# Patient Record
Sex: Female | Born: 1995 | Hispanic: No | Marital: Single
Health system: Southern US, Community
[De-identification: ages and names within clinical notes are randomized; demographics above are authoritative.]

## PROBLEM LIST (undated history)

## (undated) ENCOUNTER — Inpatient Hospital Stay (HOSPITAL_COMMUNITY): Payer: Self-pay

## (undated) DIAGNOSIS — F32A Depression, unspecified: Secondary | ICD-10-CM

## (undated) DIAGNOSIS — F419 Anxiety disorder, unspecified: Secondary | ICD-10-CM

## (undated) DIAGNOSIS — D649 Anemia, unspecified: Secondary | ICD-10-CM

## (undated) HISTORY — DX: Depression, unspecified: F32.A

## (undated) HISTORY — PX: NO PAST SURGERIES: SHX2092

## (undated) HISTORY — DX: Anemia, unspecified: D64.9

## (undated) HISTORY — DX: Anxiety disorder, unspecified: F41.9

---
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
## 2018-01-08 ENCOUNTER — Encounter (HOSPITAL_COMMUNITY): Payer: Self-pay | Admitting: Emergency Medicine

## 2018-01-08 ENCOUNTER — Ambulatory Visit (HOSPITAL_COMMUNITY)
Admission: EM | Admit: 2018-01-08 | Discharge: 2018-01-08 | Disposition: A | Discharge status: Home / Self Care | Payer: BLUE CROSS/BLUE SHIELD | Attending: Family Medicine | Admitting: Family Medicine

## 2018-01-08 DIAGNOSIS — Z3201 Encounter for pregnancy test, result positive: Secondary | ICD-10-CM

## 2018-01-08 DIAGNOSIS — Z7722 Contact with and (suspected) exposure to environmental tobacco smoke (acute) (chronic): Secondary | ICD-10-CM | POA: Diagnosis not present

## 2018-01-08 DIAGNOSIS — L709 Acne, unspecified: Secondary | ICD-10-CM | POA: Insufficient documentation

## 2018-01-08 DIAGNOSIS — Z113 Encounter for screening for infections with a predominantly sexual mode of transmission: Secondary | ICD-10-CM

## 2018-01-08 DIAGNOSIS — Z331 Pregnant state, incidental: Secondary | ICD-10-CM | POA: Diagnosis not present

## 2018-01-08 DIAGNOSIS — N926 Irregular menstruation, unspecified: Secondary | ICD-10-CM

## 2018-01-08 DIAGNOSIS — Z202 Contact with and (suspected) exposure to infections with a predominantly sexual mode of transmission: Secondary | ICD-10-CM | POA: Diagnosis not present

## 2018-01-08 DIAGNOSIS — Z3491 Encounter for supervision of normal pregnancy, unspecified, first trimester: Secondary | ICD-10-CM

## 2018-01-08 DIAGNOSIS — N912 Amenorrhea, unspecified: Secondary | ICD-10-CM | POA: Insufficient documentation

## 2018-01-08 LAB — POCT URINALYSIS DIP (DEVICE)
Bilirubin Urine: NEGATIVE
Glucose, UA: NEGATIVE mg/dL
HGB URINE DIPSTICK: NEGATIVE
Ketones, ur: NEGATIVE mg/dL
NITRITE: NEGATIVE
Protein, ur: NEGATIVE mg/dL
Specific Gravity, Urine: >=1.03 (ref 1.005–1.030)
Urobilinogen, UA: 0.2 mg/dL (ref 0.0–1.0)
pH: 6 (ref 5.0–8.0)

## 2018-01-08 LAB — POCT PREGNANCY, URINE: Preg Test, Ur: POSITIVE — AB

## 2018-01-08 MED ORDER — AZITHROMYCIN 250 MG PO TABS
ORAL_TABLET | ORAL | Status: AC
  Filled 2018-01-08: qty 4

## 2018-01-08 MED ORDER — AZITHROMYCIN 250 MG PO TABS
1000.0000 mg | ORAL_TABLET | Freq: Once | ORAL | Status: AC
  Administered 2018-01-08: 1000 mg via ORAL

## 2018-01-08 MED ORDER — CEFTRIAXONE SODIUM 250 MG IJ SOLR
250.0000 mg | Freq: Once | INTRAMUSCULAR | Status: AC
  Administered 2018-01-08: 250 mg via INTRAMUSCULAR

## 2018-01-08 MED ORDER — PRENATAL COMPLETE 14-0.4 MG PO TABS: 1.0000 | ORAL_TABLET | Freq: Every morning | ORAL | 4 refills | Status: AC

## 2018-01-08 MED ORDER — CEFTRIAXONE SODIUM 250 MG IJ SOLR
INTRAMUSCULAR | Status: AC
  Filled 2018-01-08: qty 250

## 2018-01-08 MED ORDER — CLINDAMYCIN PHOSPHATE 1 % EX GEL: Freq: Two times a day (BID) | CUTANEOUS | 11 refills | Status: DC

## 2018-01-08 MED ORDER — LIDOCAINE HCL (PF) 1 % IJ SOLN
INTRAMUSCULAR | Status: AC
  Filled 2018-01-08: qty 2

## 2018-01-08 NOTE — Discharge Instructions (Addendum)
Make appointment with Dr. Tiburcio Pea to get complete physical  You will need to see an obstetrician about the pregnancy.  I am prescribing prenatal vitamins.

## 2018-01-08 NOTE — ED Triage Notes (Signed)
Pt requesting STD screening  

## 2018-01-08 NOTE — ED Provider Notes (Signed)
[facility]    CSN: [id number] Arrival date & time: 01/08/18  1254     History   Chief Complaint Chief Complaint  Patient presents with  . Exposure to STD    HPI Annette Price is a 22 y.o. female.   Initial visit  22 year old female presents for evaluation of possible STD.  Her boyfriend was treated with shot and pills, told her he had gonorrhea and chlamydia.  No fever, acute abdominal pain, nausea, vomiting, or vaginal bleeding  Last menstrual period was 3 months ago.  Patient has intermittent lower abdominal pains off an on chronically.  Patient works at [employer].     History reviewed. No pertinent past medical history.  There are no active problems to display for this patient.   History reviewed. No pertinent surgical history.  OB History   None      Home Medications    Prior to Admission medications   Medication Sig Start Date End Date Taking? Authorizing Provider  Prenatal Vit-Fe Fumarate-FA (PRENATAL COMPLETE) 14-0.4 MG TABS Take 1 tablet by mouth every morning. 01/08/18   Elvina Sidle, MD    Family History History reviewed. No pertinent family history.  Social History Social History   Tobacco Use  . Smoking status: Passive Smoke Exposure - Never Smoker  Substance Use Topics  . Alcohol use: No  . Drug use: Yes    Types: Marijuana     Allergies   Patient has no known allergies.   Review of Systems Review of Systems   Physical Exam Triage Vital Signs ED Triage Vitals  Enc Vitals Group     BP      Pulse      Resp      Temp      Temp src      SpO2      Weight      Height      Head Circumference      Peak Flow      Pain Score      Pain Loc      Pain Edu?      Excl. in GC?    No data found.  Updated Vital Signs BP 138/84 (BP Location: Left Arm)   Pulse 98   Temp 100 F (37.8 C) (Oral)   Resp 18   SpO2 97%    Physical Exam  Constitutional: She is oriented to person, place, and time. She  appears well-developed and well-nourished.  Marfan body habitus  Eyes: Pupils are equal, round, and reactive to light. Conjunctivae are normal.  Neck: Normal range of motion. Neck supple.  Mild diffusely smooth palpable thyroid  Cardiovascular: Normal rate, regular rhythm and normal heart sounds.  Pulmonary/Chest: Effort normal and breath sounds normal.  Neurological: She is alert and oriented to person, place, and time.  Skin: Skin is warm.  Facial acne  Psychiatric: She has a normal mood and affect. Her behavior is normal.  Nursing note and vitals reviewed.    UC Treatments / Results  Labs (all labs ordered are listed, but only abnormal results are displayed) Labs Reviewed  POCT URINALYSIS DIP (DEVICE) - Abnormal; Notable for the following components:      Result Value   Leukocytes, UA SMALL (*)    All other components within normal limits  POCT PREGNANCY, URINE - Abnormal; Notable for the following components:   Preg Test, Ur POSITIVE (*)    All other components within normal limits  URINE CULTURE  CERVICOVAGINAL ANCILLARY ONLY    EKG None  Radiology No results found.  Procedures Procedures (including critical care time)  Medications Ordered in UC Medications  azithromycin (ZITHROMAX) tablet 1,000 mg (has no administration in time range)  cefTRIAXone (ROCEPHIN) injection 250 mg (has no administration in time range)    Initial Impression / Assessment and Plan / UC Course  I have reviewed the triage vital signs and the nursing notes.  Pertinent labs & imaging results that were available during my care of the patient were reviewed by me and considered in my medical decision making (see chart for details).    Final Clinical Impressions(s) / UC Diagnoses   Final diagnoses:  Possible exposure to STD  Acne, unspecified acne type  Amenorrhea  First trimester pregnancy     Discharge Instructions     Make appointment with Dr. Tiburcio Pea to get complete  physical  You will need to see an obstetrician about the pregnancy.  I am prescribing prenatal vitamins.    ED Prescriptions    Medication Sig Dispense Auth. Provider   clindamycin (CLINDAGEL) 1 % gel  (Status: Discontinued) Apply topically 2 (two) times daily. 30 g Elvina Sidle, MD   Prenatal Vit-Fe Fumarate-FA (PRENATAL COMPLETE) 14-0.4 MG TABS Take 1 tablet by mouth every morning. 60 each Elvina Sidle, MD     Controlled Substance Prescriptions [place] Controlled Substance Registry consulted? No   Elvina Sidle, MD 01/08/18 [phone]

## 2018-01-09 LAB — URINE CYTOLOGY ANCILLARY ONLY
Chlamydia: POSITIVE — AB
Neisseria Gonorrhea: NEGATIVE
Trichomonas: POSITIVE — AB

## 2018-01-09 LAB — URINE CULTURE: Culture: <10000 — AB

## 2018-01-10 ENCOUNTER — Telehealth: Payer: Self-pay | Admitting: Emergency Medicine

## 2018-01-10 MED ORDER — METRONIDAZOLE 500 MG PO TABS: 500.0000 mg | ORAL_TABLET | Freq: Two times a day (BID) | ORAL | 0 refills | Status: DC

## 2018-01-10 NOTE — Telephone Encounter (Signed)
Patient called back and was given results for positive Trichomonas. Rx for Flagyl 500 mg, BID for 7 days was sent to the pharmacy of record. Educated patient to refrain from sexual intercourse for 7 days to give the medicine time to work. Advised patient to notify sexual partners so they can be tested/treated. Condoms may reduce risk of reinfection. Recheck for further evaluation if symptoms are not improving. Patient verbalized understanding and had no further questions or concerns.

## 2018-01-10 NOTE — Telephone Encounter (Signed)
Trichomonas is positive. Rx  for Flagyl 500 mg, BID for 7 days was sent to the pharmacy of record. Pt needs education to refrain from sexual intercourse for 7 days to give the medicine time to work. Sexual partners need to be notified and tested/treated. Condoms may reduce risk of reinfection. Recheck for further evaluation if symptoms are not improving. [facility] notified.  Patient was called no answer, left generic message to return call.

## 2018-01-17 ENCOUNTER — Other Ambulatory Visit: Payer: Self-pay

## 2018-01-17 ENCOUNTER — Emergency Department (HOSPITAL_COMMUNITY)
Admission: EM | Admit: 2018-01-17 | Discharge: 2018-01-17 | Disposition: A | Discharge status: Home / Self Care | Payer: BLUE CROSS/BLUE SHIELD | Attending: Emergency Medicine | Admitting: Emergency Medicine

## 2018-01-17 ENCOUNTER — Encounter (HOSPITAL_COMMUNITY): Payer: Self-pay | Admitting: Pharmacy Technician

## 2018-01-17 DIAGNOSIS — Z7722 Contact with and (suspected) exposure to environmental tobacco smoke (acute) (chronic): Secondary | ICD-10-CM | POA: Diagnosis not present

## 2018-01-17 DIAGNOSIS — Z79899 Other long term (current) drug therapy: Secondary | ICD-10-CM | POA: Insufficient documentation

## 2018-01-17 DIAGNOSIS — Z3A00 Weeks of gestation of pregnancy not specified: Secondary | ICD-10-CM | POA: Diagnosis not present

## 2018-01-17 DIAGNOSIS — Z3490 Encounter for supervision of normal pregnancy, unspecified, unspecified trimester: Secondary | ICD-10-CM

## 2018-01-17 DIAGNOSIS — O219 Vomiting of pregnancy, unspecified: Secondary | ICD-10-CM | POA: Insufficient documentation

## 2018-01-17 DIAGNOSIS — R112 Nausea with vomiting, unspecified: Secondary | ICD-10-CM

## 2018-01-17 LAB — COMPREHENSIVE METABOLIC PANEL
ALT: 13 U/L (ref 0–44)
AST: 18 U/L (ref 15–41)
Albumin: 4.7 g/dL (ref 3.5–5.0)
Alkaline Phosphatase: 43 U/L (ref 38–126)
Anion gap: 10 (ref 5–15)
BUN: 11 mg/dL (ref 6–20)
CHLORIDE: 103 mmol/L (ref 98–111)
CO2: 20 mmol/L — ABNORMAL LOW (ref 22–32)
CREATININE: 0.7 mg/dL (ref 0.44–1.00)
Calcium: 10.1 mg/dL (ref 8.9–10.3)
Glucose, Bld: 92 mg/dL (ref 70–99)
POTASSIUM: 3.7 mmol/L (ref 3.5–5.1)
Sodium: 133 mmol/L — ABNORMAL LOW (ref 135–145)
Total Bilirubin: 0.8 mg/dL (ref 0.3–1.2)
Total Protein: 8 g/dL (ref 6.5–8.1)

## 2018-01-17 LAB — URINALYSIS, ROUTINE W REFLEX MICROSCOPIC
BILIRUBIN URINE: NEGATIVE
GLUCOSE, UA: NEGATIVE mg/dL
HGB URINE DIPSTICK: NEGATIVE
KETONES UR: 20 mg/dL — AB
Leukocytes, UA: NEGATIVE
NITRITE: NEGATIVE
PH: 5 (ref 5.0–8.0)
Protein, ur: NEGATIVE mg/dL
Specific Gravity, Urine: 1.02 (ref 1.005–1.030)

## 2018-01-17 LAB — CBC WITH DIFFERENTIAL/PLATELET
BASOS ABS: 0.1 10*3/uL (ref 0.0–0.1)
Basophils Relative: 1 %
Eosinophils Absolute: 0 10*3/uL (ref 0.0–0.5)
Eosinophils Relative: 0 %
HCT: 41.5 % (ref 36.0–46.0)
HEMOGLOBIN: 12.9 g/dL (ref 12.0–15.0)
LYMPHS PCT: 22 %
Lymphs Abs: 2.2 10*3/uL (ref 0.7–4.0)
MCH: 21.6 pg — ABNORMAL LOW (ref 26.0–34.0)
MCHC: 31.1 g/dL (ref 30.0–36.0)
MCV: 69.5 fL — ABNORMAL LOW (ref 80.0–100.0)
Monocytes Absolute: 0.3 10*3/uL (ref 0.1–1.0)
Monocytes Relative: 3 %
NRBC: 0 % (ref 0.0–0.2)
Neutro Abs: 7.5 10*3/uL (ref 1.7–7.7)
Neutrophils Relative %: 74 %
PLATELETS: 268 10*3/uL (ref 150–400)
RBC: 5.97 MIL/uL — AB (ref 3.87–5.11)
RDW: 13.7 % (ref 11.5–15.5)
WBC: 10.1 10*3/uL (ref 4.0–10.5)
nRBC: 0 /100 WBC

## 2018-01-17 LAB — I-STAT BETA HCG BLOOD, ED (MC, WL, AP ONLY)

## 2018-01-17 LAB — LIPASE, BLOOD: LIPASE: 26 U/L (ref 11–51)

## 2018-01-17 MED ORDER — DOXYLAMINE-PYRIDOXINE 10-10 MG PO TBEC: 1.0000 | DELAYED_RELEASE_TABLET | Freq: Every day | ORAL | 0 refills | Status: DC

## 2018-01-17 MED ORDER — SODIUM CHLORIDE 0.9 % IV SOLN
Freq: Once | INTRAVENOUS | Status: AC
  Administered 2018-01-17: 14:00:00 via INTRAVENOUS

## 2018-01-17 MED ORDER — SODIUM CHLORIDE 0.9 % IV BOLUS
1000.0000 mL | Freq: Once | INTRAVENOUS | Status: AC
  Administered 2018-01-17: 1000 mL via INTRAVENOUS

## 2018-01-17 MED ORDER — METOCLOPRAMIDE HCL 5 MG/ML IJ SOLN
5.0000 mg | Freq: Once | INTRAMUSCULAR | Status: AC
  Administered 2018-01-17: 5 mg via INTRAVENOUS
  Filled 2018-01-17: qty 2

## 2018-01-17 MED ORDER — DIPHENHYDRAMINE HCL 50 MG/ML IJ SOLN
12.5000 mg | Freq: Once | INTRAMUSCULAR | Status: AC
  Administered 2018-01-17: 12.5 mg via INTRAVENOUS
  Filled 2018-01-17: qty 1

## 2018-01-17 NOTE — ED Triage Notes (Signed)
Pt from home with Decreased po intake for several days. Reports weakness, NV.

## 2018-01-17 NOTE — Discharge Instructions (Signed)
You were seen in the ER today for nausea, vomiting, and generally not feeling well.  Your work-up in the emergency department was overall reassuring.  It appears that you are pregnant, we are unsure of exactly how far along you are.  Otherwise your labs seem that you have a bit dehydrated, you were given IV fluids in the emergency department.  We are sending you home with a prescription for Ticlid just, this is a antinausea medication to help with nausea and vomiting in pregnancy.  Please take this as prescribed.  Should you get to the pharmacy and this is overly expensive please discuss with the pharmacist regarding the over-the-counter components of this medicine (doxylamine and pyridoxine) as this may be a cheaper option and it is the same type of medicine when combined.  We would also like you to discuss with the pharmacy selection of a prenatal vitamin to start taking.  Please do not consume alcohol or utilize illicit drugs.  We have prescribed you new medication(s) today. Discuss the medications prescribed today with your pharmacist as they can have adverse effects and interactions with your other medicines including over the counter and prescribed medications. Seek medical evaluation if you start to experience new or abnormal symptoms after taking one of these medicines, seek care immediately if you start to experience difficulty breathing, feeling of your throat closing, facial swelling, or rash as these could be indications of a more serious allergic reaction  We would like you to follow-up with our women's health clinic within the next 1 week to establish care to further evaluate this pregnancy.  Please return to the ER or go to the MAU (womens ER) provided in your discharge instructions should you experience abdominal pain, vaginal bleeding, inability to keep fluids down, or any other concerns that you may have.

## 2018-01-17 NOTE — ED Notes (Signed)
Patient verbalizes understanding of discharge instructions. Opportunity for questioning and answers were provided. 

## 2018-01-17 NOTE — ED Notes (Signed)
Pt states she thinks she may be pregnant. Unsure of LMP. Pt states irregular periods.

## 2018-01-17 NOTE — ED Provider Notes (Signed)
[facility] EMERGENCY DEPARTMENT Provider Note   CSN: [id number] Arrival date & time: 01/17/18  1229  History   Chief Complaint Chief Complaint  Patient presents with  . Emesis    HPI Annette Price is a 22 y.o. female without significant past medical history presents to the emergency department with complaints of general malaise as well as nausea with daily episodes of vomiting over the past 1 week.  Patient states she has been generally feeling poorly in general. She states that each morning she wakes up fairly nauseous and has one episode of nonbloody emesis.  She states she proceeded to feel nauseous throughout the day and has had significant difficulty with p.o. intake secondary to this which has resulted in generalized weakness.  No specific alleviating or aggravating factors to the symptoms.  She is not currently any pain.  She states that she is sexually active in a monogamous relationship and there is a chance of pregnancy.  She is unsure when her last period was and states that her periods are irregular.  She denies fever, chills, abdominal pain, pelvic pain, vaginal bleeding, vaginal discharge, or dysuria.  No sick contacts with similar symptoms.  She has not been pregnant in the past.  HPI  History reviewed. No pertinent past medical history.  There are no active problems to display for this patient.   History reviewed. No pertinent surgical history.   OB History   None      Home Medications    Prior to Admission medications   Medication Sig Start Date End Date Taking? Authorizing Provider  metroNIDAZOLE (FLAGYL) 500 MG tablet Take 1 tablet (500 mg total) by mouth 2 (two) times daily. 01/10/18   Eustace Moore, MD  Prenatal Vit-Fe Fumarate-FA (PRENATAL COMPLETE) 14-0.4 MG TABS Take 1 tablet by mouth every morning. 01/08/18   Elvina Sidle, MD    Family History No family history on file.  Social History Social History   Tobacco Use  .  Smoking status: Passive Smoke Exposure - Never Smoker  Substance Use Topics  . Alcohol use: No  . Drug use: Yes    Types: Marijuana     Allergies   Patient has no known allergies.   Review of Systems Review of Systems  Constitutional: Positive for fatigue. Negative for chills and fever.  Respiratory: Negative for shortness of breath.   Cardiovascular: Negative for chest pain.  Gastrointestinal: Positive for nausea and vomiting. Negative for abdominal pain, blood in stool, constipation and diarrhea.  Genitourinary: Negative for dysuria, vaginal bleeding and vaginal discharge.  Neurological: Positive for weakness (generalized).  All other systems reviewed and are negative.   Physical Exam Updated Vital Signs BP (!) 145/105   Pulse (!) 116   Temp 98.1 F (36.7 C) (Oral)   Resp 20   Ht 6\' 2"  (1.88 m)   Wt 75.8 kg   SpO2 100%   BMI 21.46 kg/m   Physical Exam  Constitutional: She appears well-developed and well-nourished.  Non-toxic appearance. No distress.  HENT:  Head: Normocephalic and atraumatic.  Eyes: Conjunctivae are normal. Right eye exhibits no discharge. Left eye exhibits no discharge.  Neck: Neck supple.  Cardiovascular: Regular rhythm. Tachycardia present.  No murmur heard. Pulmonary/Chest: Effort normal and breath sounds normal. No respiratory distress. She has no wheezes. She has no rhonchi. She has no rales.  Respiration even and unlabored  Abdominal: Soft. She exhibits no distension. There is no tenderness. There is no rigidity, no rebound, no  guarding and no CVA tenderness.  Neurological: She is alert.  Clear speech.   Skin: Skin is warm and dry. No rash noted.  Psychiatric: Her behavior is normal. Her mood appears anxious (during IV placement).  Nursing note and vitals reviewed.    ED Treatments / Results  Labs (all labs ordered are listed, but only abnormal results are displayed) Labs Reviewed  COMPREHENSIVE METABOLIC PANEL - Abnormal; Notable  for the following components:      Result Value   Sodium 133 (*)    CO2 20 (*)    All other components within normal limits  CBC WITH DIFFERENTIAL/PLATELET - Abnormal; Notable for the following components:   RBC 5.97 (*)    MCV 69.5 (*)    MCH 21.6 (*)    All other components within normal limits  URINALYSIS, ROUTINE W REFLEX MICROSCOPIC - Abnormal; Notable for the following components:   APPearance HAZY (*)    Ketones, ur 20 (*)    All other components within normal limits  I-STAT BETA HCG BLOOD, ED ([facility], [facility], [facility] ONLY) - Abnormal; Notable for the following components:   I-stat hCG, quantitative >2,000.0 (*)    All other components within normal limits  LIPASE, BLOOD    EKG None  Radiology No results found.  Procedures Procedures (including critical care time)  Medications Ordered in ED Medications  sodium chloride 0.9 % bolus 1,000 mL (1,000 mLs Intravenous New Bag/Given 01/17/18 1248)     Initial Impression / Assessment and Plan / ED Course  I have reviewed the triage vital signs and the nursing notes.  Pertinent labs & imaging results that were available during my care of the patient were reviewed by me and considered in my medical decision making (see chart for details).   Patient presents to the emergency department with complaints of general malaise, decreased p.o. intake secondary to nausea with daily episodes of emesis in the a.m.  Patient nontoxic-appearing, she is anxious and tearful upon IV insertion on my initial assessment with tachycardia as well-this normalized once patient was able to calm down.  She has a benign physical exam otherwise.  No abdominal tenderness.  Will obtain pregnancy test with abdominal labs.  Patient's I-stat beta hCG is elevated > 2,000- she is without abdominal pain or vaginal bleeding to prompt emergent [place] in the ED today, low suspicion for ectopic pregnancy without these signs/sxs as well as with normalized vital signs. Additional lab  work reviewed. LFTs/lipase WNL. Hgb/hct WNL. No leukocytosis. Mild hyponatremia at 133, bicarb minimally decreased at 20. Urinalysis without infection, there is some ketonuria, specific gravity WNL. Patient receivined fluids resuscitation in the ER. Suspect patient's sxs are secondary to pregnancy, low suspicion for acute surgical abdomen in setting of lack of abdominal pain without tenderness or peritoneal signs on exam. Discussed with Dr. Donnald Garre who recommends reglan/benadry in the ED, with instructions for diclegis at home which I am in agreement.   Patient feeling much better following fluids in the ER and is tolerating PO. Will discharge home  diclegis, it appears that her insurance will cover this- but we discussed if high cost discussing OTC pills to combine to make this medication w/ phramacy, starting of prenatals, with obgyn follow up and strict return precautions. I discussed results, treatment plan, need for follow-up, and return precautions with the patient. Provided opportunity for questions, patient confirmed understanding and is in agreement with plan.   Findings and plan of care discussed with supervising physician Dr. Donnald Garre who  is in agreement.   Vitals:   01/17/18 1430 01/17/18 1520  BP: 108/60 109/68  Pulse: 63 68  Resp: 18 18  Temp:    SpO2: 100% 97%    Final Clinical Impressions(s) / ED Diagnoses   Final diagnoses:  Pregnancy, unspecified gestational age  Nausea and vomiting, intractability of vomiting not specified, unspecified vomiting type    ED Discharge Orders         Ordered    Doxylamine-Pyridoxine 10-10 MG TBEC  Daily at bedtime     01/17/18 [address]1524           Elmore Hyslop R, PA-C 01/17/18 1526    Arby BarrettePfeiffer, Marcy, MD 01/18/18 [id number]

## 2018-03-01 LAB — OB RESULTS CONSOLE RPR: RPR: NONREACTIVE

## 2018-03-01 LAB — OB RESULTS CONSOLE HIV ANTIBODY (ROUTINE TESTING): HIV: NONREACTIVE

## 2018-03-01 LAB — OB RESULTS CONSOLE HEPATITIS B SURFACE ANTIGEN: Hepatitis B Surface Ag: NEGATIVE

## 2018-03-01 LAB — OB RESULTS CONSOLE RUBELLA ANTIBODY, IGM: Rubella: IMMUNE

## 2018-03-06 NOTE — L&D Delivery Note (Signed)
Operative Delivery Note Patient pushed for approximately 30 minutes after she was noted to be C/C/+2.  There was prolonged bradycardia when the baby was crowning so decision made to place an outlet vacuum.   Risks and benefits discussed in detail.  Risks include, but are not limited to the risks of anesthesia, bleeding, infection, damage to maternal tissues, fetal cephalhematoma.  There is also the risk of inability to effect vaginal delivery of the head, or shoulder dystocia that cannot be resolved by established maneuvers, leading to the need for emergency cesarean section.  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 Bladder was just previously emptied by removal of the foley.  Epidural was found to be adequate.  Vertex was +3/+4 station.  midline episiotomy was cut after infiltration with 1% lidocaine.  The Kiwi vacuum was placed.  With maternal effort and 2 pulls,  At 4:28 PM a viable and healthy female was delivered via Vaginal, Vacuum Neurosurgeon) (Presentation:OA ).  APGAR: 8, 9; weight pending.   DOUBLE TRUE KNOT seen.    Cord was double clamped and cut and the infant handed to the waiting Peds team.  Infant was noted to be moving all four extremities and crying vigorously. Cord blood was obtained. The placenta delivered spontaneously intact 3 vessels noted.     Uterine atony was present and with IV pitocin, massage it was alleviated.    The Episiotomy was repaired in routine fashion noted to be a second degree with the sphincter intact.  The patient tolerated procedure well. Placenta status: intact 3 vessels    Anesthesia:  Epidural and 1% lidocaine Episiotomy: Median Lacerations: 2nd degree Suture Repair: 2.0 vicryl Est. Blood Loss (mL): 174  Mom to postpartum.  Baby to Couplet care / Skin to Skin.  [place], [employer] 08/30/2018, 5:13 PM

## 2018-04-27 ENCOUNTER — Ambulatory Visit (HOSPITAL_COMMUNITY)
Admission: EM | Admit: 2018-04-27 | Discharge: 2018-04-27 | Disposition: A | Discharge status: Home / Self Care | Payer: Medicaid Other | Attending: Family Medicine | Admitting: Family Medicine

## 2018-04-27 ENCOUNTER — Other Ambulatory Visit: Payer: Self-pay

## 2018-04-27 ENCOUNTER — Encounter (HOSPITAL_COMMUNITY): Payer: Self-pay

## 2018-04-27 DIAGNOSIS — Z331 Pregnant state, incidental: Secondary | ICD-10-CM

## 2018-04-27 DIAGNOSIS — J020 Streptococcal pharyngitis: Secondary | ICD-10-CM

## 2018-04-27 DIAGNOSIS — Z202 Contact with and (suspected) exposure to infections with a predominantly sexual mode of transmission: Secondary | ICD-10-CM | POA: Insufficient documentation

## 2018-04-27 DIAGNOSIS — Z113 Encounter for screening for infections with a predominantly sexual mode of transmission: Secondary | ICD-10-CM | POA: Diagnosis not present

## 2018-04-27 LAB — POCT URINALYSIS DIP (DEVICE)
Bilirubin Urine: NEGATIVE
GLUCOSE, UA: 500 mg/dL — AB
HGB URINE DIPSTICK: NEGATIVE
KETONES UR: 15 mg/dL — AB
Leukocytes,Ua: NEGATIVE
Nitrite: NEGATIVE
PROTEIN: 30 mg/dL — AB
Specific Gravity, Urine: 1.02 (ref 1.005–1.030)
UROBILINOGEN UA: 0.2 mg/dL (ref 0.0–1.0)
pH: 7 (ref 5.0–8.0)

## 2018-04-27 MED ORDER — AZITHROMYCIN 250 MG PO TABS
1000.0000 mg | ORAL_TABLET | Freq: Once | ORAL | Status: AC
  Administered 2018-04-27: 1000 mg via ORAL

## 2018-04-27 MED ORDER — CEFTRIAXONE SODIUM 250 MG IJ SOLR
INTRAMUSCULAR | Status: AC
  Filled 2018-04-27: qty 250

## 2018-04-27 MED ORDER — AZITHROMYCIN 250 MG PO TABS
ORAL_TABLET | ORAL | Status: AC
  Filled 2018-04-27: qty 4

## 2018-04-27 MED ORDER — LIDOCAINE HCL (PF) 1 % IJ SOLN
INTRAMUSCULAR | Status: AC
  Filled 2018-04-27: qty 2

## 2018-04-27 MED ORDER — CEFTRIAXONE SODIUM 250 MG IJ SOLR
250.0000 mg | Freq: Once | INTRAMUSCULAR | Status: AC
  Administered 2018-04-27: 250 mg via INTRAMUSCULAR

## 2018-04-27 NOTE — ED Provider Notes (Addendum)
[facility]    CSN: [id number] Arrival date & time: 04/27/18  1009     History   Chief Complaint Chief Complaint  Patient presents with  . SEXUALLY TRANSMITTED DISEASE    HPI Annette Price is a 23 y.o. female.   Pt presents to [facility] for STD testing, pt states she had intercourse 2 days ago and is now experiencing sore throat and is concerned she may have an STD. Pt denies any symptoms. Pt is 5 months pregnant    Several months ago patient presented with similar symptoms and had chlamydia and trichomonas.  Patient had oral sex and now has sore throat.  She wants to be treated and tested for STD.       History reviewed. No pertinent past medical history.  There are no active problems to display for this patient.   History reviewed. No pertinent surgical history.  OB History    Gravida  1   Para      Term      Preterm      AB      Living        SAB      TAB      Ectopic      Multiple      Live Births               Home Medications    Prior to Admission medications   Medication Sig Start Date End Date Taking? Authorizing Provider  Prenatal Vit-Fe Fumarate-FA (PRENATAL COMPLETE) 14-0.4 MG TABS Take 1 tablet by mouth every morning. 01/08/18  Yes Elvina Sidle, MD  Doxylamine-Pyridoxine 10-10 MG TBEC Take 1 tablet by mouth at bedtime. Two tablets at bedtime on day 1 and 2; if symptoms persist (nausea vomiting), take 1 tablet in morning and 2 tablets at bedtime on day 3; if symptoms persist, may increase to 1 tablet in morning, 1 tablet mid-afternoon, and 2 tablets at bedtime on day 4 (maximum: doxylamine 40 mg/pyridoxine 40 mg (4 tablets) per day). 01/17/18   Petrucelli, Samantha R, PA-C  metroNIDAZOLE (FLAGYL) 500 MG tablet Take 1 tablet (500 mg total) by mouth 2 (two) times daily. 01/10/18   Eustace Moore, MD    Family History History reviewed. No pertinent family history.  Social History Social History   Tobacco Use  .  Smoking status: Passive Smoke Exposure - Never Smoker  . Smokeless tobacco: Never Used  Substance Use Topics  . Alcohol use: No  . Drug use: Yes    Types: Marijuana     Allergies   Patient has no known allergies.   Review of Systems Review of Systems   Physical Exam Triage Vital Signs ED Triage Vitals  Enc Vitals Group     BP 04/27/18 1042 134/74     Pulse Rate 04/27/18 1042 94     Resp 04/27/18 1042 17     Temp 04/27/18 1042 98.8 F (37.1 C)     Temp Source 04/27/18 1042 Oral     SpO2 04/27/18 1042 100 %     Weight --      Height --      Head Circumference --      Peak Flow --      Pain Score 04/27/18 1044 5     Pain Loc --      Pain Edu? --      Excl. in GC? --    No data found.  Updated Vital Signs BP 134/74 (  BP Location: Left Arm)   Pulse 94   Temp 98.8 F (37.1 C) (Oral)   Resp 17   SpO2 100%    Physical Exam Vitals signs and nursing note reviewed.  Constitutional:      Appearance: Normal appearance.  HENT:     Head: Normocephalic and atraumatic.     Nose: Nose normal.     Mouth/Throat:     Mouth: Mucous membranes are moist.     Comments: Mild throat redness posteriorly.  No exudates or swelling Eyes:     Conjunctiva/sclera: Conjunctivae normal.  Cardiovascular:     Rate and Rhythm: Normal rate.     Pulses: Normal pulses.  Pulmonary:     Effort: Pulmonary effort is normal.     Breath sounds: Normal breath sounds.  Abdominal:     Comments: Gravid abdomen with fundus about 3 cm below umbilicus  Musculoskeletal: Normal range of motion.  Skin:    General: Skin is warm and dry.  Neurological:     General: No focal deficit present.     Mental Status: She is alert.  Psychiatric:        Mood and Affect: Mood normal.        Thought Content: Thought content normal.      UC Treatments / Results  Labs (all labs ordered are listed, but only abnormal results are displayed) Labs Reviewed  POCT URINALYSIS DIP (DEVICE) - Abnormal; Notable for  the following components:      Result Value   Glucose, UA 500 (*)    Ketones, ur 15 (*)    Protein, ur 30 (*)    All other components within normal limits  CERVICOVAGINAL ANCILLARY ONLY    EKG None  Radiology No results found.  Procedures Procedures (including critical care time)  Medications Ordered in UC Medications  azithromycin (ZITHROMAX) tablet 1,000 mg (1,000 mg Oral Given 04/27/18 1111)  cefTRIAXone (ROCEPHIN) injection 250 mg (250 mg Intramuscular Given 04/27/18 1111)    Initial Impression / Assessment and Plan / UC Course  I have reviewed the triage vital signs and the nursing notes.  Pertinent labs & imaging results that were available during my care of the patient were reviewed by me and considered in my medical decision making (see chart for details).    Final Clinical Impressions(s) / UC Diagnoses   Final diagnoses:  STD exposure  Streptococcal sore throat   Discharge Instructions   None    ED Prescriptions    None     Controlled Substance Prescriptions [place] Controlled Substance Registry consulted? Not Applicable   Elvina Sidle, MD 04/27/18 1058    Elvina Sidle, MD 04/27/18 1120

## 2018-04-27 NOTE — ED Triage Notes (Addendum)
Pt presents to [facility] for STD testing, pt states she had intercourse 2 days ago and is now experiencing sore throat and is concerned she may have an STD. Pt denies any symptoms. Pt is 5 months pregnant

## 2018-04-29 LAB — CERVICOVAGINAL ANCILLARY ONLY
Bacterial vaginitis: NEGATIVE
Chlamydia: NEGATIVE
Neisseria Gonorrhea: NEGATIVE
Trichomonas: NEGATIVE

## 2018-06-04 ENCOUNTER — Inpatient Hospital Stay (HOSPITAL_COMMUNITY)
Admission: AD | Admit: 2018-06-04 | Discharge: 2018-06-04 | Disposition: A | Discharge status: Home / Self Care | Payer: Medicaid Other | Attending: Obstetrics and Gynecology | Admitting: Obstetrics and Gynecology

## 2018-06-04 ENCOUNTER — Inpatient Hospital Stay (HOSPITAL_BASED_OUTPATIENT_CLINIC_OR_DEPARTMENT_OTHER): Payer: Medicaid Other

## 2018-06-04 ENCOUNTER — Encounter (HOSPITAL_COMMUNITY): Payer: Self-pay

## 2018-06-04 DIAGNOSIS — O9989 Other specified diseases and conditions complicating pregnancy, childbirth and the puerperium: Secondary | ICD-10-CM | POA: Diagnosis not present

## 2018-06-04 DIAGNOSIS — Z7722 Contact with and (suspected) exposure to environmental tobacco smoke (acute) (chronic): Secondary | ICD-10-CM | POA: Diagnosis not present

## 2018-06-04 DIAGNOSIS — R1032 Left lower quadrant pain: Secondary | ICD-10-CM | POA: Diagnosis not present

## 2018-06-04 DIAGNOSIS — O26892 Other specified pregnancy related conditions, second trimester: Secondary | ICD-10-CM

## 2018-06-04 DIAGNOSIS — R109 Unspecified abdominal pain: Secondary | ICD-10-CM | POA: Diagnosis not present

## 2018-06-04 DIAGNOSIS — Z3A27 27 weeks gestation of pregnancy: Secondary | ICD-10-CM

## 2018-06-04 LAB — GC/CHLAMYDIA PROBE AMP (~~LOC~~) NOT AT ARMC
Chlamydia: POSITIVE — AB
Neisseria Gonorrhea: NEGATIVE

## 2018-06-04 LAB — WET PREP, GENITAL
Clue Cells Wet Prep HPF POC: NONE SEEN
SPERM: NONE SEEN
TRICH WET PREP: NONE SEEN
Yeast Wet Prep HPF POC: NONE SEEN

## 2018-06-04 LAB — FETAL FIBRONECTIN: FETAL FIBRONECTIN: NEGATIVE

## 2018-06-04 MED ORDER — MORPHINE SULFATE (PF) 4 MG/ML IV SOLN
4.0000 mg | Freq: Once | INTRAVENOUS | Status: AC
  Administered 2018-06-04: 4 mg via INTRAMUSCULAR
  Filled 2018-06-04: qty 1

## 2018-06-04 NOTE — MAU Provider Note (Signed)
History     CSN: [id number]  Arrival date and time: 06/04/18 0938   First Provider Initiated Contact with Patient 06/04/18 0356      Chief Complaint  Patient presents with  . Abdominal Pain   Annette Price is a 23 y.o. G1P0 at [redacted]w[redacted]d who presents for Abdominal Pain.  She states the pain started at 3 am and is constant.  Patient reports that the pain is on her left side and feels like a cramping, stabbing pain.  She reports that she has not taken anything for the pain. Patient endorses fetal movement and denies vaginal concerns including discharge, bleeding, and LOF.       OB History    Gravida  1   Para      Term      Preterm      AB      Living        SAB      TAB      Ectopic      Multiple      Live Births              History reviewed. No pertinent past medical history.  History reviewed. No pertinent surgical history.  No family history on file.  Social History   Tobacco Use  . Smoking status: Passive Smoke Exposure - Never Smoker  . Smokeless tobacco: Never Used  Substance Use Topics  . Alcohol use: No  . Drug use: Yes    Types: Marijuana    Comment: Prior to pregnancy    Allergies: No Known Allergies  Medications Prior to Admission  Medication Sig Dispense Refill Last Dose  . Prenatal Vit-Fe Fumarate-FA (PRENATAL COMPLETE) 14-0.4 MG TABS Take 1 tablet by mouth every morning. 60 each 4 06/03/2018 at Unknown time  . Doxylamine-Pyridoxine 10-10 MG TBEC Take 1 tablet by mouth at bedtime. Two tablets at bedtime on day 1 and 2; if symptoms persist (nausea vomiting), take 1 tablet in morning and 2 tablets at bedtime on day 3; if symptoms persist, may increase to 1 tablet in morning, 1 tablet mid-afternoon, and 2 tablets at bedtime on day 4 (maximum: doxylamine 40 mg/pyridoxine 40 mg (4 tablets) per day). 60 tablet 0 Unknown at Unknown time  . metroNIDAZOLE (FLAGYL) 500 MG tablet Take 1 tablet (500 mg total) by mouth 2 (two) times daily. 14 tablet  0 Unknown at Unknown time    Review of Systems  Gastrointestinal: Positive for nausea and vomiting. Negative for abdominal pain (Left Side), constipation and diarrhea.  Genitourinary: Negative for dysuria, vaginal bleeding, vaginal discharge and vaginal pain.  Musculoskeletal: Positive for back pain.  Neurological: Negative for dizziness, light-headedness and headaches.   Physical Exam   Blood pressure 139/79, pulse 82, temperature 98.1 F (36.7 C), resp. rate 19.  Physical Exam  Constitutional: She is oriented to person, place, and time. She appears well-developed and well-nourished.  HENT:  Head: Normocephalic and atraumatic.  Eyes: Conjunctivae are normal.  Neck: Normal range of motion.  Cardiovascular: Normal rate, regular rhythm and normal heart sounds.  Respiratory: Effort normal and breath sounds normal.  GI: Soft. Bowel sounds are normal. There is abdominal tenderness (Left side only).  Genitourinary: Cervix exhibits no discharge.    Vaginal discharge present.     No vaginal bleeding.  No bleeding in the vagina.    Genitourinary Comments: Sterile Speculum Exam: -Vaginal Vault: Pink mucosa.  Moderate amt milky curdy consistency  discharge in vault -wet prep collected -Cervix:Pink,  no lesions, cysts, or polyps.  Appears closed. No active bleeding or discharge from os-GC/CT collected -Bimanual Exam: Closed/Long/Thick   Neurological: She is alert and oriented to person, place, and time.  Skin: Skin is warm.  Psychiatric: She has a normal mood and affect.    Fetal Assessment 150 bpm Toco: None graphed  MAU Course   Results for orders placed or performed during the hospital encounter of 06/04/18 (from the past 24 hour(s))  Wet prep, genital     Status: Abnormal   Collection Time: 06/04/18  4:12 AM  Result Value Ref Range   Yeast Wet Prep HPF POC NONE SEEN NONE SEEN   Trich, Wet Prep NONE SEEN NONE SEEN   Clue Cells Wet Prep HPF POC NONE SEEN NONE SEEN   WBC, Wet Prep  HPF POC FEW (A) NONE SEEN   Sperm NONE SEEN   Fetal fibronectin     Status: None   Collection Time: 06/04/18  4:12 AM  Result Value Ref Range   Fetal Fibronectin NEGATIVE NEGATIVE   [place] Results-Preliminary: FHR 144, Cephalic AFI WNL Largest Pocket 4.4cm No placental abruption or previa identified. Uterus without abnormality. Right and Left Ovary WNL. No free fluid in cul de sac.  MDM PE Labs: Pain Medication EFM Ultrasound Assessment and Plan  23 year old G1P0 at 44 weeks Left Flank Pain  -Exam findings discussed -FM inconclusive due to patient discomfort with monitors.  -Patient offered and accepts pain medication; Morphine 4mg  IM now -Labs Pending -Patient encouraged to drink fluids to promote urination.  -Will await results.  Follow Up (4:52 AM) Patient refusing EFM Will send for limited ob US now   Follow Up (5:42 AM)  -Nurse reports patient reports improvement of pain. -[place] returns without abnormal findings. -In room to assess and discuss results. -Patient states she is still unable to urinate; will cancel order.  -Patient without questions or concerns. -Reports next appt in one month.  Will send message for patient to be seen in next week for follow up. -Instructed to contact primary ob if symptoms return or worsen. -Discharged to home in improved condition  Cherre Robins MSN, CNM 06/04/2018, 3:56 AM

## 2018-06-04 NOTE — MAU Note (Signed)
Patient refuses to allow RN to apply toco.  Provider notified.

## 2018-06-04 NOTE — MAU Note (Signed)
Patient refuses to allow RN to adjust monitor to trace FHR.  Provider notified.

## 2018-06-04 NOTE — MAU Note (Signed)
Presents via EMS for left lower abdominal pain.  No LOF/VB.  States she woke up with the pain it started dull and has gotten worse.  Hasn't taken anything for the pain.  Also started vomiting from the pain.  Reports no complications w/ the pregnancy

## 2018-06-04 NOTE — Discharge Instructions (Signed)
Abdominal Pain During Pregnancy ° °Abdominal pain is common during pregnancy, and has many possible causes. Some causes are more serious than others, and sometimes the cause is not known. Abdominal pain can be a sign that labor is starting. It can also be caused by normal growth and stretching of muscles and ligaments during pregnancy. Always tell your health care provider if you have any abdominal pain. °Follow these instructions at home: °· Do not have sex or put anything in your vagina until your pain goes away completely. °· Get plenty of rest until your pain improves. °· Drink enough fluid to keep your urine pale yellow. °· Take over-the-counter and prescription medicines only as told by your health care provider. °· Keep all follow-up visits as told by your health care provider. This is important. °Contact a health care provider if: °· Your pain continues or gets worse after resting. °· You have lower abdominal pain that: °? Comes and goes at regular intervals. °? Spreads to your back. °? Is similar to menstrual cramps. °· You have pain or burning when you urinate. °Get help right away if: °· You have a fever or chills. °· You have vaginal bleeding. °· You are leaking fluid from your vagina. °· You are passing tissue from your vagina. °· You have vomiting or diarrhea that lasts for more than 24 hours. °· Your baby is moving less than usual. °· You feel very weak or faint. °· You have shortness of breath. °· You develop severe pain in your upper abdomen. °Summary °· Abdominal pain is common during pregnancy, and has many possible causes. °· If you experience abdominal pain during pregnancy, tell your health care provider right away. °· Follow your health care provider's home care instructions and keep all follow-up visits as directed. °This information is not intended to replace advice given to you by your health care provider. Make sure you discuss any questions you have with your health care  provider. °Document Released: 02/20/2005 Document Revised: 05/25/2016 Document Reviewed: 05/25/2016 °Elsevier Interactive Patient Education © 2019 [employer] ° °

## 2018-06-05 ENCOUNTER — Other Ambulatory Visit: Payer: Self-pay

## 2018-06-05 DIAGNOSIS — O98812 Other maternal infectious and parasitic diseases complicating pregnancy, second trimester: Principal | ICD-10-CM

## 2018-06-05 DIAGNOSIS — A749 Chlamydial infection, unspecified: Secondary | ICD-10-CM | POA: Insufficient documentation

## 2018-06-05 MED ORDER — AZITHROMYCIN 500 MG PO TABS: 1000.0000 mg | ORAL_TABLET | Freq: Once | ORAL | 0 refills | Status: AC

## 2018-06-05 NOTE — Progress Notes (Signed)
Patient called and identity verified by birthdate and last 4 of SSN.  Patient informed of results and need for treatment.  Questions and concerns addressed.  Pharmacy verified as [facility] on [address].  Patient instructed to avoid sexual activity until 7 days after partner treatment. Informed that partner could seek treatment at local health department.  Rx for Azithromycin 500mg  tablets, Disp 2, RF 0 to be taken in one dose.  JE, CNM

## 2018-07-22 LAB — OB RESULTS CONSOLE GC/CHLAMYDIA
Chlamydia: NEGATIVE
Gonorrhea: NEGATIVE

## 2018-08-01 LAB — OB RESULTS CONSOLE GBS: GBS: POSITIVE

## 2018-08-29 ENCOUNTER — Encounter (HOSPITAL_COMMUNITY): Payer: Self-pay | Admitting: *Deleted

## 2018-08-29 ENCOUNTER — Inpatient Hospital Stay (HOSPITAL_COMMUNITY)
Admission: AD | Admit: 2018-08-29 | Discharge: 2018-09-01 | DRG: 807 | Disposition: A | Discharge status: Home / Self Care | Payer: Medicaid Other | Attending: Obstetrics & Gynecology | Admitting: Obstetrics & Gynecology

## 2018-08-29 ENCOUNTER — Inpatient Hospital Stay (HOSPITAL_COMMUNITY): Payer: Medicaid Other

## 2018-08-29 ENCOUNTER — Other Ambulatory Visit: Payer: Self-pay

## 2018-08-29 DIAGNOSIS — Z7722 Contact with and (suspected) exposure to environmental tobacco smoke (acute) (chronic): Secondary | ICD-10-CM | POA: Diagnosis present

## 2018-08-29 DIAGNOSIS — O99820 Streptococcus B carrier state complicating pregnancy: Secondary | ICD-10-CM

## 2018-08-29 DIAGNOSIS — O368130 Decreased fetal movements, third trimester, not applicable or unspecified: Secondary | ICD-10-CM | POA: Diagnosis present

## 2018-08-29 DIAGNOSIS — Z3A39 39 weeks gestation of pregnancy: Secondary | ICD-10-CM | POA: Diagnosis not present

## 2018-08-29 DIAGNOSIS — O9081 Anemia of the puerperium: Secondary | ICD-10-CM | POA: Diagnosis present

## 2018-08-29 DIAGNOSIS — D563 Thalassemia minor: Secondary | ICD-10-CM

## 2018-08-29 DIAGNOSIS — Z1159 Encounter for screening for other viral diseases: Secondary | ICD-10-CM

## 2018-08-29 DIAGNOSIS — Z9289 Personal history of other medical treatment: Secondary | ICD-10-CM

## 2018-08-29 DIAGNOSIS — O99824 Streptococcus B carrier state complicating childbirth: Secondary | ICD-10-CM | POA: Diagnosis present

## 2018-08-29 DIAGNOSIS — O368190 Decreased fetal movements, unspecified trimester, not applicable or unspecified: Secondary | ICD-10-CM | POA: Diagnosis present

## 2018-08-29 LAB — COMPREHENSIVE METABOLIC PANEL
ALT: 17 U/L (ref 0–44)
AST: 21 U/L (ref 15–41)
Albumin: 3.2 g/dL — ABNORMAL LOW (ref 3.5–5.0)
Alkaline Phosphatase: 120 U/L (ref 38–126)
Anion gap: 11 (ref 5–15)
BUN: 9 mg/dL (ref 6–20)
CO2: 17 mmol/L — ABNORMAL LOW (ref 22–32)
Calcium: 9 mg/dL (ref 8.9–10.3)
Chloride: 107 mmol/L (ref 98–111)
Creatinine, Ser: 0.65 mg/dL (ref 0.44–1.00)
GFR calc Af Amer: >60 mL/min (ref >60)
GFR calc non Af Amer: >60 mL/min (ref >60)
Glucose, Bld: 108 mg/dL — ABNORMAL HIGH (ref 70–99)
Potassium: 3.7 mmol/L (ref 3.5–5.1)
Sodium: 135 mmol/L (ref 135–145)
Total Bilirubin: 0.6 mg/dL (ref 0.3–1.2)
Total Protein: 6.9 g/dL (ref 6.5–8.1)

## 2018-08-29 LAB — CBC
HCT: 33.1 % — ABNORMAL LOW (ref 36.0–46.0)
Hemoglobin: 10.4 g/dL — ABNORMAL LOW (ref 12.0–15.0)
MCH: 22 pg — ABNORMAL LOW (ref 26.0–34.0)
MCHC: 31.4 g/dL (ref 30.0–36.0)
MCV: 70 fL — ABNORMAL LOW (ref 80.0–100.0)
Platelets: 225 10*3/uL (ref 150–400)
RBC: 4.73 MIL/uL (ref 3.87–5.11)
RDW: 14.6 % (ref 11.5–15.5)
WBC: 12.3 10*3/uL — ABNORMAL HIGH (ref 4.0–10.5)
nRBC: 0 % (ref 0.0–0.2)

## 2018-08-29 LAB — TYPE AND SCREEN
ABO/RH(D): O POS
Antibody Screen: NEGATIVE

## 2018-08-29 LAB — SARS CORONAVIRUS 2 BY RT PCR (HOSPITAL ORDER, PERFORMED IN ~~LOC~~ HOSPITAL LAB): SARS Coronavirus 2: NEGATIVE

## 2018-08-29 LAB — PROTEIN / CREATININE RATIO, URINE
Creatinine, Urine: 73.62 mg/dL
Protein Creatinine Ratio: 0.1 mg/mg{Cre} (ref 0.00–0.15)
Total Protein, Urine: 7 mg/dL

## 2018-08-29 LAB — ABO/RH: ABO/RH(D): O POS

## 2018-08-29 MED ORDER — LACTATED RINGERS IV SOLN: 500.0000 mL | INTRAVENOUS | Status: DC | PRN

## 2018-08-29 MED ORDER — OXYTOCIN BOLUS FROM INFUSION
500.0000 mL | Freq: Once | INTRAVENOUS | Status: AC
  Administered 2018-08-30: 500 mL via INTRAVENOUS

## 2018-08-29 MED ORDER — SOD CITRATE-CITRIC ACID 500-334 MG/5ML PO SOLN: 30.0000 mL | ORAL | Status: DC | PRN

## 2018-08-29 MED ORDER — OXYTOCIN 40 UNITS IN NORMAL SALINE INFUSION - SIMPLE MED: 2.5000 [IU]/h | INTRAVENOUS | Status: DC

## 2018-08-29 MED ORDER — SODIUM CHLORIDE 0.9 % IV SOLN
5.0000 10*6.[IU] | Freq: Once | INTRAVENOUS | Status: AC
  Administered 2018-08-29: 5 10*6.[IU] via INTRAVENOUS
  Filled 2018-08-29: qty 5

## 2018-08-29 MED ORDER — OXYTOCIN 40 UNITS IN NORMAL SALINE INFUSION - SIMPLE MED
1.0000 m[IU]/min | INTRAVENOUS | Status: DC
  Administered 2018-08-29: 1 m[IU]/min via INTRAVENOUS
  Filled 2018-08-29: qty 1000

## 2018-08-29 MED ORDER — OXYCODONE-ACETAMINOPHEN 5-325 MG PO TABS: 1.0000 | ORAL_TABLET | ORAL | Status: DC | PRN

## 2018-08-29 MED ORDER — TERBUTALINE SULFATE 1 MG/ML IJ SOLN: 0.2500 mg | Freq: Once | INTRAMUSCULAR | Status: DC | PRN

## 2018-08-29 MED ORDER — PENICILLIN G 3 MILLION UNITS IVPB - SIMPLE MED
3.0000 10*6.[IU] | INTRAVENOUS | Status: DC
  Administered 2018-08-30 (×4): 3 10*6.[IU] via INTRAVENOUS
  Filled 2018-08-29 (×4): qty 100

## 2018-08-29 MED ORDER — LACTATED RINGERS IV SOLN
INTRAVENOUS | Status: DC
  Administered 2018-08-29 – 2018-08-30 (×3): via INTRAVENOUS

## 2018-08-29 MED ORDER — ACETAMINOPHEN 325 MG PO TABS: 650.0000 mg | ORAL_TABLET | ORAL | Status: DC | PRN

## 2018-08-29 MED ORDER — LIDOCAINE HCL (PF) 1 % IJ SOLN
30.0000 mL | INTRAMUSCULAR | Status: DC | PRN
  Administered 2018-08-30: 30 mL via SUBCUTANEOUS
  Filled 2018-08-29: qty 30

## 2018-08-29 MED ORDER — ONDANSETRON HCL 4 MG/2ML IJ SOLN: 4.0000 mg | Freq: Four times a day (QID) | INTRAMUSCULAR | Status: DC | PRN

## 2018-08-29 MED ORDER — OXYCODONE-ACETAMINOPHEN 5-325 MG PO TABS: 2.0000 | ORAL_TABLET | ORAL | Status: DC | PRN

## 2018-08-29 NOTE — MAU Provider Note (Addendum)
History     CSN: [id number]  Arrival date and time: 08/29/18 2706   First Provider Initiated Contact with Patient 08/29/18 1912      Chief Complaint  Patient presents with  . Decreased Fetal Movement   [name] is a 23 y.o. G1P0 at [redacted]w[redacted]d who presents to MAU for feeling decreased fetal movement beginning today around 1500.  Last food/drink: 1730 Smoker? no Current medications/supplements: metronidazole for BV, PNVs, Fe Recent AFI: no, last [place] on file 05/2018 Anterior placenta? yes Doing FKCs? no Problems this pregnancy include: none. Pt denies prior instances of DFM. Pt denies all risk factors for stillbirth, including, but not limited to: IUGR, placental abruption, infection, genetic/congenital anomalies, fetomaternal hemorrhage, DM, HTN, smoking/drug use, umbilical cord/placental abnormalities, uterine abnormalities, fetal hydrops, arrythmia, platelet dysfunction, IHCP.  Pt denies VB, LOF, ctx, vaginal discharge/odor/itching.  Allergies? NKDA Prenatal care provider/next appt? [facility], next appt 09/03/2018   OB History    Gravida  1   Para      Term      Preterm      AB      Living        SAB      TAB      Ectopic      Multiple      Live Births              History reviewed. No pertinent past medical history.  History reviewed. No pertinent surgical history.  History reviewed. No pertinent family history.  Social History   Tobacco Use  . Smoking status: Passive Smoke Exposure - Never Smoker  . Smokeless tobacco: Never Used  Substance Use Topics  . Alcohol use: No  . Drug use: Yes    Types: Marijuana    Comment: Prior to pregnancy    Allergies: No Known Allergies  Medications Prior to Admission  Medication Sig Dispense Refill Last Dose  . Doxylamine-Pyridoxine 10-10 MG TBEC Take 1 tablet by mouth at bedtime. Two tablets at bedtime on day 1 and 2; if symptoms persist (nausea vomiting), take 1 tablet in morning and 2 tablets at  bedtime on day 3; if symptoms persist, may increase to 1 tablet in morning, 1 tablet mid-afternoon, and 2 tablets at bedtime on day 4 (maximum: doxylamine 40 mg/pyridoxine 40 mg (4 tablets) per day). 60 tablet 0   . Prenatal Vit-Fe Fumarate-FA (PRENATAL COMPLETE) 14-0.4 MG TABS Take 1 tablet by mouth every morning. 60 each 4     Review of Systems  Constitutional: Negative for chills, diaphoresis, fatigue and fever.  Respiratory: Negative for shortness of breath.   Cardiovascular: Negative for chest pain.  Gastrointestinal: Negative for abdominal pain, constipation, diarrhea, nausea and vomiting.  Genitourinary: Negative for dysuria, flank pain, frequency, pelvic pain, urgency, vaginal bleeding and vaginal discharge.  Neurological: Negative for dizziness, weakness, light-headedness and numbness.   Physical Exam   Blood pressure 133/83, pulse 98, temperature 99 F (37.2 C), resp. rate 18, height 6\' 2"  (1.88 m), weight 88.5 kg.  Patient Vitals for the past 24 hrs:  BP Temp Pulse Resp Height Weight  08/29/18 1835 133/83 99 F (37.2 C) 98 18 6\' 2"  (1.88 m) 88.5 kg   Physical Exam  Constitutional: She is oriented to person, place, and time. She appears well-developed and well-nourished. No distress.  HENT:  Head: Normocephalic and atraumatic.  Respiratory: Effort normal.  GI: Soft. She exhibits no distension and no mass. There is abdominal tenderness in the left upper quadrant.  There is no rebound and no guarding.  Neurological: She is alert and oriented to person, place, and time.  Skin: Skin is warm and dry. She is not diaphoretic.  Psychiatric: She has a normal mood and affect. Her behavior is normal. Judgment and thought content normal.   No results found for this or any previous visit (from the past 24 hour(s)).  No results found.  MAU Course  Procedures  MDM -DFM -EFM: non-reactive with variables       -baseline: 140       -variability: moderate       -accels: absent        -decels: present, few variable       -TOCO: irregular ctx, pt denies feeling -BPP ordered, results pending at time of care transfer, care transferred to Annette Price, CNM  Nugent, Odie SeraNicole E, NP  8:12 PM 08/29/2018  Assessment and Plan  Just prior to US , FHR pattern became reactive BPP 2/8, 4/10 w RNST Dr Richardson Doppole consulted with presentation, history and findings Will admit  Their CNM will come and see her.   Aviva SignsWilliams, Sarina Robleto L, CNM

## 2018-08-29 NOTE — H&P (Signed)
Annette Price is a 23 y.o. female, G1P0000, IUP at 39.2 weeks, presenting for IOL for DFM in MAU today and BPP of 4/10, no breathing, tone or movement noted, but now strip is reactive, CAT 1.  CXT showing on toco strip 2.5-3.5 mins aparet and regular, but pt denies feeling them. Denies leakage of fluids or vaginal bleeding. GBS+. EFW 6.7lbs via US on 6/04. H.O beta thalassemia trait. H/O depression no meds.   Patient Active Problem List   Diagnosis Date Noted  . Chlamydia infection affecting pregnancy in second trimester 06/05/2018   Medications Prior to Admission  Medication Sig Dispense Refill Last Dose  . Doxylamine-Pyridoxine 10-10 MG TBEC Take 1 tablet by mouth at bedtime. Two tablets at bedtime on day 1 and 2; if symptoms persist (nausea vomiting), take 1 tablet in morning and 2 tablets at bedtime on day 3; if symptoms persist, may increase to 1 tablet in morning, 1 tablet mid-afternoon, and 2 tablets at bedtime on day 4 (maximum: doxylamine 40 mg/pyridoxine 40 mg (4 tablets) per day). 60 tablet 0   . Prenatal Vit-Fe Fumarate-FA (PRENATAL COMPLETE) 14-0.4 MG TABS Take 1 tablet by mouth every morning. 60 each 4     History reviewed. No pertinent past medical history.   No current facility-administered medications on file prior to encounter.    Current Outpatient Medications on File Prior to Encounter  Medication Sig Dispense Refill  . Doxylamine-Pyridoxine 10-10 MG TBEC Take 1 tablet by mouth at bedtime. Two tablets at bedtime on day 1 and 2; if symptoms persist (nausea vomiting), take 1 tablet in morning and 2 tablets at bedtime on day 3; if symptoms persist, may increase to 1 tablet in morning, 1 tablet mid-afternoon, and 2 tablets at bedtime on day 4 (maximum: doxylamine 40 mg/pyridoxine 40 mg (4 tablets) per day). 60 tablet 0  . Prenatal Vit-Fe Fumarate-FA (PRENATAL COMPLETE) 14-0.4 MG TABS Take 1 tablet by mouth every morning. 60 each 4     No Known Allergies  History of present  pregnancy: Pt Info/Preference:  Screening/Consents:  Labs:   EDD: Estimated Date of Delivery: 09/03/18  Establised: No LMP recorded. Patient is pregnant.  Anatomy Scan: Date: 04/17/2018 Placenta Location: anterior Genetic Screen: Panoroma: declined AFP:  First Tri: Quad:  Office: [facility]             First PNV: 15 wg Blood Type --/--/PENDING (06/25 2135)  Language: english Last PNV: 38.6 wg Rhogam    Flu Vaccine:  utd   Antibody PENDING (06/25 2135)  TDaP vaccine utd   GTT: Early: 5.2 hga1c Third Trimester: normal  Feeding Plan: bottle BTL: no Rubella:    Contraception: ??? VBAC: no RPR:     Circumcision: Baby Female   HBsAg:    Pediatrician:  ???   HIV:     Prenatal Classes: no Additional US: 6/4 growth see below  GBS:  (For PCN allergy, check sensitivities)       Chlamydia: meg    MFM Referral/Consult:  GC: meg  Support Person: partner   PAP: Never in life due to age  Pain Management: epidural Neonatologist Referral:  Hgb Electrophoresis:  Beta thal trait  Birth Plan: none   Hgb NOB: 11    28W: 9.7  6/4 growth  OB History    Gravida  1   Para      Term      Preterm      AB      Living  SAB      TAB      Ectopic      Multiple      Live Births             History reviewed. No pertinent past medical history. History reviewed. No pertinent surgical history. Family History: family history is not on file. Social History:  reports that she is a non-smoker but has been exposed to tobacco smoke. She has never used smokeless tobacco. She reports current drug use. Drug: Marijuana. She reports that she does not drink alcohol.   Prenatal Transfer Tool  Maternal Diabetes: No Genetic Screening: Declined Maternal Ultrasounds/Referrals: Normal Fetal Ultrasounds or other Referrals:  Other: BPP today of 4/10 Maternal Substance Abuse:  No Significant Maternal Medications:  None Significant Maternal Lab Results: Group B Strep positive  ROS:  Review of Systems   Constitutional: Negative.   HENT: Negative.   Eyes: Negative.   Respiratory: Negative.   Cardiovascular: Negative.   Gastrointestinal: Negative.   Genitourinary: Negative.   Musculoskeletal: Negative.   Skin: Negative.   Neurological: Negative.   Endo/Heme/Allergies: Negative.   Psychiatric/Behavioral: Negative.      Physical Exam: BP (!) 149/93 (BP Location: Right Arm)   Pulse 94   Temp 99 F (37.2 C)   Resp 16   Ht 6\' 2"  (1.88 m)   Wt 88.5 kg   BMI 25.04 kg/m   Physical Exam  Constitutional: She is oriented to person, place, and time and well-developed, well-nourished, and in no distress.  HENT:  Head: Normocephalic and atraumatic.  Eyes: Pupils are equal, round, and reactive to light. Conjunctivae are normal.  Neck: Normal range of motion. Neck supple.  Cardiovascular: Normal rate and regular rhythm.  Pulmonary/Chest: Effort normal and breath sounds normal.  Abdominal: Soft. Bowel sounds are normal.  Genitourinary:    Genitourinary Comments: Uterus gravida and equal to dates, soft, non-tender, pelvis adequate for vaginal delivery.    Musculoskeletal: Normal range of motion.  Neurological: She is alert and oriented to person, place, and time. She has normal reflexes. Gait normal.  Skin: Skin is warm and dry.  Psychiatric: Affect normal.  Nursing note and vitals reviewed.    NST: FHR baseline 140 bpm, Variability: moderate, Accelerations:present, Decelerations:  Absent= Cat 1/Reactive UC:   regular, every 2-4 minutes, lasting 60 seconds SVE:   Dilation: 2 Effacement (%): 70 Station: Ballotable Exam by:: K. WeissRN, vertex verified by fetal sutures.  Leopold's: Position vertex, EFW 7lbs via leopold's.   Labs: Results for orders placed or performed during the hospital encounter of 08/29/18 (from the past 24 hour(s))  Type and screen [place]     Status: None (Preliminary result)   Collection Time: 08/29/18  9:35 PM  Result Value Ref Range    ABO/RH(D) PENDING    Antibody Screen PENDING    Sample Expiration      09/01/2018,2359 Performed at [facility], [place] [address], [place], [place] [address]     Imaging:  No results found.  MAU Course: Orders Placed This Encounter  Procedures  . SARS Coronavirus 2 (CEPHEID - Performed in [place] hospital lab), [facility]  . [place] MFM FETAL BPP WO NON STRESS  . CBC  . RPR  . Comprehensive metabolic panel  . Protein / creatinine ratio, urine  . Cervical Exam  . Type and screen [place]   Meds ordered this encounter  Medications  . lactated ringers infusion  . oxytocin (  PITOCIN) IV infusion 40 units in NS 1000 mL - Premix    Assessment/Plan: Annette Price is a 23 y.o. female, G1P0000, IUP at 39.2 weeks, presenting for IOL for DFM in MAU today and BPP of 4/10, no breathing, tone or movement noted, but now strip is reactive, CAT 1.  CXT showing on toco strip 2.5-3.5 mins aparet and regular, but pt denies feeling them. Denies leakage of fluids or vaginal bleeding. GBS+. EFW 6.7lbs via US on 6/04. H.O beta thalassemia trait. H/O depression no meds.   FWB: Cat 1 Fetal Tracing.   Plan: Admit to Birthing Suite per consult with DR Richardson Doppole Routine [facility] orders Induction to start with pitocin 1x1  Pain med/epidural prn PCN G for GBS prophylaxis  Anticipate labor progression   Dale DurhamJade Glendene Wyer NP-C, CNM, MSN 08/29/2018, 10:02 PM

## 2018-08-29 NOTE — MAU Note (Signed)
Pt reprots she had not felt baby move much since earlier this morning. Stated she was feeling very weak having an upset stomach for about 30 min. Tried eating something and still felt bad and tthen ate some more. still did feel good and baby did not move for about 2 hours

## 2018-08-30 ENCOUNTER — Encounter (HOSPITAL_COMMUNITY): Payer: Self-pay

## 2018-08-30 ENCOUNTER — Inpatient Hospital Stay (HOSPITAL_COMMUNITY): Payer: Medicaid Other | Admitting: Anesthesiology

## 2018-08-30 LAB — RPR: RPR Ser Ql: NONREACTIVE

## 2018-08-30 MED ORDER — OXYCODONE-ACETAMINOPHEN 5-325 MG PO TABS: 1.0000 | ORAL_TABLET | ORAL | Status: DC | PRN

## 2018-08-30 MED ORDER — ONDANSETRON HCL 4 MG PO TABS
4.0000 mg | ORAL_TABLET | ORAL | Status: DC | PRN
  Administered 2018-08-31: 4 mg via ORAL
  Filled 2018-08-30: qty 1

## 2018-08-30 MED ORDER — LIDOCAINE HCL (PF) 1 % IJ SOLN
INTRAMUSCULAR | Status: DC | PRN
  Administered 2018-08-30: 10 mL via EPIDURAL

## 2018-08-30 MED ORDER — PHENYLEPHRINE 40 MCG/ML (10ML) SYRINGE FOR IV PUSH (FOR BLOOD PRESSURE SUPPORT): 80.0000 ug | PREFILLED_SYRINGE | INTRAVENOUS | Status: DC | PRN

## 2018-08-30 MED ORDER — DIPHENHYDRAMINE HCL 50 MG/ML IJ SOLN: 12.5000 mg | INTRAMUSCULAR | Status: DC | PRN

## 2018-08-30 MED ORDER — COCONUT OIL OIL: 1.0000 "application " | TOPICAL_OIL | Status: DC | PRN

## 2018-08-30 MED ORDER — SODIUM CHLORIDE (PF) 0.9 % IJ SOLN
INTRAMUSCULAR | Status: DC | PRN
  Administered 2018-08-30: 14 mL/h via EPIDURAL

## 2018-08-30 MED ORDER — ACETAMINOPHEN 325 MG PO TABS
650.0000 mg | ORAL_TABLET | ORAL | Status: DC | PRN
  Filled 2018-08-30: qty 2

## 2018-08-30 MED ORDER — SIMETHICONE 80 MG PO CHEW: 80.0000 mg | CHEWABLE_TABLET | ORAL | Status: DC | PRN

## 2018-08-30 MED ORDER — EPHEDRINE 5 MG/ML INJ: 10.0000 mg | INTRAVENOUS | Status: DC | PRN

## 2018-08-30 MED ORDER — WITCH HAZEL-GLYCERIN EX PADS: 1.0000 "application " | MEDICATED_PAD | CUTANEOUS | Status: DC | PRN

## 2018-08-30 MED ORDER — DIPHENHYDRAMINE HCL 25 MG PO CAPS: 25.0000 mg | ORAL_CAPSULE | Freq: Four times a day (QID) | ORAL | Status: DC | PRN

## 2018-08-30 MED ORDER — ZOLPIDEM TARTRATE 5 MG PO TABS: 5.0000 mg | ORAL_TABLET | Freq: Every evening | ORAL | Status: DC | PRN

## 2018-08-30 MED ORDER — OXYCODONE-ACETAMINOPHEN 5-325 MG PO TABS: 2.0000 | ORAL_TABLET | ORAL | Status: DC | PRN

## 2018-08-30 MED ORDER — SENNOSIDES-DOCUSATE SODIUM 8.6-50 MG PO TABS
2.0000 | ORAL_TABLET | ORAL | Status: DC
  Administered 2018-08-30 – 2018-08-31 (×2): 2 via ORAL
  Filled 2018-08-30 (×2): qty 2

## 2018-08-30 MED ORDER — FENTANYL-BUPIVACAINE-NACL 0.5-0.125-0.9 MG/250ML-% EP SOLN
12.0000 mL/h | EPIDURAL | Status: DC | PRN
  Filled 2018-08-30: qty 250

## 2018-08-30 MED ORDER — IBUPROFEN 600 MG PO TABS
600.0000 mg | ORAL_TABLET | Freq: Four times a day (QID) | ORAL | Status: DC
  Administered 2018-08-30 – 2018-08-31 (×3): 600 mg via ORAL
  Filled 2018-08-30 (×3): qty 1

## 2018-08-30 MED ORDER — LACTATED RINGERS IV SOLN: INTRAVENOUS | Status: DC

## 2018-08-30 MED ORDER — BENZOCAINE-MENTHOL 20-0.5 % EX AERO
1.0000 "application " | INHALATION_SPRAY | CUTANEOUS | Status: DC | PRN
  Administered 2018-08-30: 1 via TOPICAL
  Filled 2018-08-30: qty 56

## 2018-08-30 MED ORDER — PRENATAL MULTIVITAMIN CH
1.0000 | ORAL_TABLET | Freq: Every day | ORAL | Status: DC
  Administered 2018-08-31: 1 via ORAL
  Filled 2018-08-30 (×2): qty 1

## 2018-08-30 MED ORDER — ONDANSETRON HCL 4 MG/2ML IJ SOLN: 4.0000 mg | INTRAMUSCULAR | Status: DC | PRN

## 2018-08-30 MED ORDER — DIBUCAINE (PERIANAL) 1 % EX OINT: 1.0000 "application " | TOPICAL_OINTMENT | CUTANEOUS | Status: DC | PRN

## 2018-08-30 MED ORDER — PHENYLEPHRINE 40 MCG/ML (10ML) SYRINGE FOR IV PUSH (FOR BLOOD PRESSURE SUPPORT)
80.0000 ug | PREFILLED_SYRINGE | INTRAVENOUS | Status: DC | PRN
  Filled 2018-08-30: qty 10

## 2018-08-30 MED ORDER — LACTATED RINGERS IV SOLN
500.0000 mL | Freq: Once | INTRAVENOUS | Status: AC
  Administered 2018-08-30: 500 mL via INTRAVENOUS

## 2018-08-30 MED ORDER — TETANUS-DIPHTH-ACELL PERTUSSIS 5-2.5-18.5 LF-MCG/0.5 IM SUSP: 0.5000 mL | Freq: Once | INTRAMUSCULAR | Status: DC

## 2018-08-30 NOTE — Anesthesia Procedure Notes (Signed)
Epidural Patient location during procedure: OB Start time: 08/30/2018 10:27 AM End time: 08/30/2018 10:39 AM  Staffing Anesthesiologist: Lidia Collum, MD Performed: anesthesiologist   Preanesthetic Checklist Completed: patient identified, pre-op evaluation, timeout performed, IV checked, risks and benefits discussed and monitors and equipment checked  Epidural Patient position: sitting Prep: DuraPrep Patient monitoring: heart rate, continuous pulse ox and blood pressure Approach: midline Location: L3-L4 Injection technique: LOR air  Needle:  Needle type: Tuohy  Needle gauge: 17 G Needle length: 9 cm Needle insertion depth: 6 cm Catheter type: closed end flexible Catheter size: 19 Gauge Catheter at skin depth: 11 cm Test dose: negative  Assessment Events: blood not aspirated, injection not painful, no injection resistance, negative IV test and no paresthesia  Additional Notes Reason for block:procedure for pain

## 2018-08-30 NOTE — Progress Notes (Signed)
Labor Progress Note  H&P Annette Price is a 23 y.o. female, G1P0000, IUP at 39.2 weeks, presenting for IOL for DFM in MAU 6/25 and BPP of 4/10, no breathing, tone or movement noted, but now strip is reactive, CAT 1.  CXT showing on toco strip 2.5-3.5 mins aparet and regular, but pt denies feeling them. Denies leakage of fluids or vaginal bleeding. GBS+. EFW 6.7lbs via [place] on 6/04. H.O beta thalassemia trait. H/O depression no meds.   Subjective: Pt in bed resting well, still ambulates to the bathroom, pt tolerating induction continue to denies feeling any cxt, just uncomfortable in bed. Pt alone in room.  Patient Active Problem List   Diagnosis Date Noted  . Decreased fetal movement 08/29/2018  . Chlamydia infection affecting pregnancy in second trimester 06/05/2018   Objective: BP 115/75   Pulse 64   Temp 97.6 F (36.4 C) (Axillary)   Resp 18   Ht 6\' 2"  (1.88 m)   Wt 88.5 kg   BMI 25.05 kg/m  No intake/output data recorded. No intake/output data recorded. NST: FHR baseline 125 bpm, Variability: moderate, Accelerations:present, Decelerations:  Absent= Cat 1/Reactive CTX:  regular, every 2-3.5 minutes, lasting 60-100 seconds Uterus gravid, soft non tender, mild to palpate with contractions.  SVE:  Dilation: 3 Effacement (%): 70 Station: [employer] Exam by:: [employer], CNM Pitocin at 6 mUn/min  Assessment:  Annette Price is a 23 y.o. female, G1P0000, IUP at 39.3 weeks now, presenting for IOL for DFM in MAU on 6/25 and BPP of 4/10, no breathing, tone or movement noted.  CXT showing on toco strip 2.5-3.5 mins aparet and regular, but pt denies feeling them. Denies leakage of fluids or vaginal bleeding. GBS+. EFW 6.7lbs via [place] on 6/04. H.O beta thalassemia trait. H/O depression no meds. Pt continue to progress in early labor with no feeling of cxt, to continue to increase pitocin.  Patient Active Problem List   Diagnosis Date Noted  . Decreased fetal movement 08/29/2018  .  Chlamydia infection affecting pregnancy in second trimester 06/05/2018   NICHD: Category 1  Membranes: Intact, no s/s of infection  Induction:    Pitocin - On 6 now  Pain management:               IV pain management: x o             Epidural placement: Not placed  GBS Positive  Abx: Penicillin, x2doses  Plan: Continue labor plan Continuous/intermittent monitoring Rest Ambulate Frequent position changes to facilitate fetal rotation and descent. Dr Alwyn Pea to reassess with cervical exam as indicated Continue pitocin per protocol, 2x2 Plan for epidural when pt feels pain.  Anticipate labor progression and vaginal delivery.   Md Pinn to be made aware of plan @ 0700 when she will assume care of pt.    Noralyn Pick, NP-C, CNM, MSN 08/30/2018. 4:04 AM

## 2018-08-30 NOTE — Anesthesia Preprocedure Evaluation (Signed)

## 2018-08-30 NOTE — Progress Notes (Signed)
Annette Price is a 23 y.o. G1P0 at [redacted]w[redacted]d by LMP admitted for induction of labor due to Non-reactive NST and low BPP  Subjective: Patient starting to have more contraction pain  Objective: BP 130/79   Pulse 67   Temp 97.6 F (36.4 C) (Oral)   Resp 17   Ht 6\' 2"  (1.88 m)   Wt 88.5 kg   BMI 25.05 kg/m  No intake/output data recorded. No intake/output data recorded.  FHT:  FHR: 135 bpm, variability: moderate,  accelerations:  Present,  decelerations:  Absent UC:   irregular, every 2-3 minutes SVE:   Dilation: 4 Effacement (%): 70 Station: -1 Exam by:: Dr. Alwyn Pea AROM clear fluid  Labs: Lab Results  Component Value Date   WBC 12.3 (H) 08/29/2018   HGB 10.4 (L) 08/29/2018   HCT 33.1 (L) 08/29/2018   MCV 70.0 (L) 08/29/2018   PLT 225 08/29/2018    Assessment / Plan: Induction of labor due to non-reassuring fetal testing,  progressing well on pitocin  Labor: Progressing on Pitocin,  AROM done clear fluid Preeclampsia:  no signs or symptoms of toxicity and intake and ouput balanced Fetal Wellbeing:  Category I Pain Control:  Epidural - at patient request I/D:  n/a Anticipated MOD:  NSVD  Annette Price, [place] 08/30/2018, 9:28 AM

## 2018-08-31 LAB — CBC
HCT: 25.7 % — ABNORMAL LOW (ref 36.0–46.0)
Hemoglobin: 8 g/dL — ABNORMAL LOW (ref 12.0–15.0)
MCH: 21.9 pg — ABNORMAL LOW (ref 26.0–34.0)
MCHC: 31.1 g/dL (ref 30.0–36.0)
MCV: 70.2 fL — ABNORMAL LOW (ref 80.0–100.0)
Platelets: 176 10*3/uL (ref 150–400)
RBC: 3.66 MIL/uL — ABNORMAL LOW (ref 3.87–5.11)
RDW: 14.5 % (ref 11.5–15.5)
WBC: 14.3 10*3/uL — ABNORMAL HIGH (ref 4.0–10.5)
nRBC: 0 % (ref 0.0–0.2)

## 2018-08-31 MED ORDER — ACETAMINOPHEN 160 MG/5ML PO SOLN
650.0000 mg | ORAL | Status: DC | PRN
  Administered 2018-08-31: 650 mg via ORAL
  Filled 2018-08-31: qty 20.3

## 2018-08-31 MED ORDER — IBUPROFEN 100 MG/5ML PO SUSP
600.0000 mg | Freq: Four times a day (QID) | ORAL | Status: DC
  Administered 2018-08-31 – 2018-09-01 (×4): 600 mg via ORAL
  Filled 2018-08-31 (×5): qty 30

## 2018-08-31 MED ORDER — FERROUS SULFATE 325 (65 FE) MG PO TABS
325.0000 mg | ORAL_TABLET | Freq: Two times a day (BID) | ORAL | Status: DC
  Administered 2018-08-31 – 2018-09-01 (×3): 325 mg via ORAL
  Filled 2018-08-31 (×3): qty 1

## 2018-08-31 MED ORDER — MEDROXYPROGESTERONE ACETATE 150 MG/ML IM SUSP
150.0000 mg | Freq: Once | INTRAMUSCULAR | Status: AC
  Administered 2018-08-31: 150 mg via INTRAMUSCULAR
  Filled 2018-08-31: qty 1

## 2018-08-31 NOTE — Progress Notes (Signed)
Subjective: Postpartum Day # 1 : S/P NSVD due to IOL for BPP of 4/10 with reactive NST, pt was vacuum assisted delivery with medline episiotomy. Had elevated BP during delivery x2 but no dx was made, preeclampsia labs drawn on 6/26 and all negative, currently denies HA< ruq pain or vision changes. Patient up ad lib, denies syncope or dizziness. Reports consuming regular diet without issues and denies N/V. Patient reports 0 bowel movement + passing flatus.  Denies issues with urination and reports bleeding is "light."  Patient is bottle feeding and reports not going well, baby small and peds wants to watch baby for one more day for feeding assessment.  Desires depot for postpartum contraception.  Pain is being appropriately managed with use of po meds. Pt anemic without s/sx.   Midline episiotomy laceration Feeding:  bottle Contraceptive plan:  depot Baby Female  Objective: Vital signs in last 24 hours: Patient Vitals for the past 24 hrs:  BP Temp Temp src Pulse Resp SpO2  08/31/18 0356 107/60 97.6 F (36.4 C) Oral (!) 50 16 100 %  08/31/18 0037 108/69 97.8 F (36.6 C) Oral 65 16 -  08/30/18 2000 119/85 98 F (36.7 C) Oral 70 16 100 %  08/30/18 1838 129/70 97.8 F (36.6 C) Oral 70 18 -  08/30/18 1801 122/77 - - 87 16 -  08/30/18 1746 120/61 - - (!) 59 18 -  08/30/18 1734 121/78 - - 74 18 -  08/30/18 1716 125/71 - - 71 17 -  08/30/18 1701 133/83 - - 84 17 -  08/30/18 1646 (!) 118/98 - - 93 18 -  08/30/18 1631 138/76 - - (!) 124 16 -  08/30/18 1625 - - - - - 100 %  08/30/18 1620 - - - - - 100 %  08/30/18 1615 - - - - - 100 %  08/30/18 1613 123/72 - - (!) 134 17 -  08/30/18 1610 - - - - - 100 %  08/30/18 1605 - - - - - 100 %  08/30/18 1531 (!) 101/57 - - 83 16 -  08/30/18 1524 - 98.5 F (36.9 C) Oral - - -  08/30/18 1501 (!) 102/49 - - 82 16 -  08/30/18 1431 (!) 110/52 - - 69 16 -  08/30/18 1331 130/72 - - 73 17 -  08/30/18 1317 - 98 F (36.7 C) Oral - - -  08/30/18 1301 131/74 -  - 74 16 -  08/30/18 1201 (!) 98/55 - - 65 18 -  08/30/18 1131 111/78 - - 79 17 -  08/30/18 1106 125/67 - - 70 - -  08/30/18 1105 - - - - - 99 %  08/30/18 1101 122/78 - - 74 17 -  08/30/18 1100 - - - - - 100 %  08/30/18 1056 122/74 - - 64 18 -  08/30/18 1055 - - - - - 100 %  08/30/18 1053 - (!) 97.4 F (36.3 C) Axillary - - -  08/30/18 1051 122/76 - - 64 18 -  08/30/18 1050 - - - - - 97 %  08/30/18 1046 116/76 - - 75 16 -  08/30/18 1045 - - - - - 98 %  08/30/18 1043 123/75 - - 69 - -  08/30/18 1040 - - - - - 96 %  08/30/18 1037 131/82 - - 69 16 -  08/30/18 1036 - - - - - 100 %  08/30/18 1035 - - - - - 100 %  08/30/18 1034 (!)  149/84 - - 94 18 -  08/30/18 0938 (!) 132/91 - - 84 17 -  08/30/18 0848 130/79 - - 67 - -  08/30/18 0831 126/83 - - 74 17 -  08/30/18 0801 124/75 - - 73 16 -  08/30/18 0742 131/79 - - 81 18 -  08/30/18 0725 - 97.6 F (36.4 C) Oral - - -  08/30/18 0701 (!) 106/56 - - 68 - -  08/30/18 0631 106/63 - - 68 - -  08/30/18 0601 115/62 - - 65 - -     Physical Exam:  General: alert, cooperative, appears stated age and no distress Mood/Affect: Happy Lungs: clear to auscultation, no wheezes, rales or rhonchi, symmetric air entry.  Heart: normal rate, regular rhythm, normal S1, S2, no murmurs, rubs, clicks or gallops. Breast: breasts appear normal, no suspicious masses, no skin or nipple changes or axillary nodes. Abdomen:  + bowel sounds, soft, non-tender GU: perineum approximate, healing well. No signs of external hematomas.  Uterine Fundus: firm Lochia: appropriate Skin: Warm, Dry. DVT Evaluation: No evidence of DVT seen on physical exam. Negative Homan's sign. No cords or calf tenderness. No significant calf/ankle edema.  CBC Latest Ref Rng & Units 08/29/2018 01/17/2018 04/08/2015  WBC 4.0 - 10.5 K/uL 12.3(H) 10.1 6.7  Hemoglobin 12.0 - 15.0 g/dL 10.4(L) 12.9 11.6(L)  Hematocrit 36.0 - 46.0 % 33.1(L) 41.5 35.0(L)  Platelets 150 - 400 K/uL 225 268 231     No results found for this or any previous visit (from the past 24 hour(s)).   CBG (last 3)  No results for input(s): GLUCAP in the last 72 hours.   I/O last 3 completed shifts: In: -  Out: 1856 [Urine:1225; Blood:631]   Assessment Postpartum Day # 1 : S/P NSVD due to IOL for 4/10 BPP. Elevated BP during labor , 6/26 -labs, BP 107/60. Pt stable. -2 involution. bottlefeeding. Hemodynamically stable with drop post delivery with QBl of 631, 10.3-8.0, asymptomatic. Peds keeping baby female for x1 more day dur to low-ish birth weight and poor feeding.   Plan: Continue other mgmt as ordered Anemic: Start Iron BID today.  VTE prophylactics: Early ambulated as tolerates.  Pain control: Motrin/Tylenol PRN Education given regarding options for contraception, including barrier methods, injectable contraception, IUD placement, oral contraceptives.  Plan for discharge tomorrow and Contraception Depo order to be given today.    Dr. Mora ApplPinn to be updated on patient status, with Dr Su Hiltoberts on the phone @ 0700.  [facility] Memorial HospitalJade Mitul Hallowell NP-C, CNM 08/31/2018, 5:51 AM

## 2018-08-31 NOTE — Clinical Social Work Maternal (Signed)
CLINICAL SOCIAL WORK MATERNAL/CHILD NOTE  Patient Details  Name: Ayano Douthitt MRN: [id number] Date of Birth: Jun 05, 1995  Date:  08/31/2018  Clinical Social Worker Initiating Note:  Abundio Miu, [place] Date/Time: Initiated:  08/31/18/1527     Child's Name:  Charlton [place]   Biological Parents:  Mother, Father(Father: Kate Sable)   Need for Interpreter:  None   Reason for Referral:  Current Substance Use/Substance Use During Pregnancy    Address:  [place] [address]    Phone number:  [phone] (home)     Additional phone number:   Household Members/Support Persons (HM/SP):   Household Member/Support Person 1   HM/SP Name Relationship DOB or Age  HM/SP -1   Father    HM/SP -2        HM/SP -3        HM/SP -4        HM/SP -5        HM/SP -6        HM/SP -7        HM/SP -8          Natural Supports (not living in the home):  Parent, Other (Comment)(FOB's mother)   Professional Supports: None   Employment: Animator   Type of Work: Therapist, nutritional   Education:  High school graduate   Homebound arranged:    Museum/gallery curator Resources:  [employer]   Other Resources:  [employer], Physicist, medical    Cultural/Religious Considerations Which May Impact Care:    Strengths:  Ability to meet basic needs , Home prepared for child    Psychotropic Medications:         Pediatrician:       Pediatrician List:   [place]      Pediatrician Fax Number:    Risk Factors/Current Problems:  None   Cognitive State:  Alert , Insightful , Linear Thinking , Goal Oriented    Mood/Affect:  Calm , Happy , Interested , Relaxed    CSW Assessment: CSW initially consulted for "thc use". Per OB records, MOB used marijuana prior to pregnancy. CSW will not follow UDS nor CDS out due to the following: MOB had no documented substance use after initial  prenatal visit/+UPT. MOB had no positive drug screens after initial prenatal visit/+UPT. Baby's UDS is negative.  CSW met with MOB at bedside due to documented history of depression and co-sleeping. CSW introduced self and explained reason for visit. MOB was welcoming and engaged during assessment. MOB was awake and laying on her side and infant was laying beside MOB on her back. MOB reported that she resides with her father and works as a Loss adjuster, chartered. MOB reported that she receives [facility] and [employer], noting the food stamps are only $16 and will be cut off when she goes back to work because of her salary. MOB reported that she has all items needed for infant including a car seat and crib. CSW inquired about MOB's support system, MOB reported that her father and FOB's mother are her supports.   CSW inquired about MOB's mental health history, MOB denied any mental health history. MOB denied a history of depression. MOB presented calm and did not demonstrate any acute mental health signs/symptoms. CSW assessed for safety, MOB denied SI, HI and domestic violence.   CSW provided education regarding the  baby blues period vs. perinatal mood disorders, discussed treatment and gave resources for mental health follow up if concerns arise.  CSW recommends self-evaluation during the postpartum time period using the New Mom Checklist from Postpartum Progress and encouraged MOB to contact a medical professional if symptoms are noted at any time.    CSW provided review of Sudden Infant Death Syndrome (SIDS) precautions.  CSW emphasized the importance of not co-sleeping. MOB verbalized understanding and reported that infant will sleep in her crib.   CSW inquired if MOB was interested in parenting education programs for additional supports, MOB declined.   CSW identifies no further need for intervention and no barriers to discharge at this time.  CSW Plan/Description:  No Further Intervention Required/No  Barriers to Discharge, Sudden Infant Death Syndrome (SIDS) Education, Perinatal Mood and Anxiety Disorder (PMADs) Education    Burnis Medin, LCSW 08/31/2018, 3:30 PM

## 2018-08-31 NOTE — Anesthesia Postprocedure Evaluation (Signed)
Anesthesia Post Note  Patient: Annette Price  Procedure(s) Performed: AN AD [place]     Patient location during evaluation: Mother Baby Anesthesia Type: Epidural Level of consciousness: awake and alert and oriented Pain management: satisfactory to patient Vital Signs Assessment: post-procedure vital signs reviewed and stable Respiratory status: respiratory function stable Cardiovascular status: stable Postop Assessment: no headache, no backache, epidural receding, patient able to bend at knees, no signs of nausea or vomiting and adequate PO intake Anesthetic complications: no    Last Vitals:  Vitals:   08/31/18 0037 08/31/18 0356  BP: 108/69 107/60  Pulse: 65 (!) 50  Resp: 16 16  Temp: 36.6 C 36.4 C  SpO2:  100%    Last Pain:  Vitals:   08/31/18 0356  TempSrc: Oral  PainSc:    Pain Goal: Patients Stated Pain Goal: 7 (08/30/18 0750)                 Katherina Mires

## 2018-09-01 DIAGNOSIS — O9081 Anemia of the puerperium: Secondary | ICD-10-CM | POA: Diagnosis present

## 2018-09-01 MED ORDER — IBUPROFEN 100 MG/5ML PO SUSP: 600.0000 mg | Freq: Four times a day (QID) | ORAL | 0 refills | Status: AC

## 2018-09-01 NOTE — Discharge Summary (Signed)
SVD OB Discharge Summary     Patient Name: Annette Price DOB: 10-16-1995 MRN: [id number]  Date of admission: 08/29/2018 Delivering MD: Sanjuana Kava  Date of delivery: 08/30/2018 Type of delivery: SVD  Newborn Data: Sex: Baby  female  Live born female  Birth Weight: 6 lb 10.9 oz (3031 g) APGAR: 8, 9  Newborn Delivery   Birth date/time: 08/30/2018 16:28:00 Delivery type: Vaginal, Vacuum (Extractor)      Feeding: bottle Infant being discharge to home with mother in stable condition.   Admitting diagnosis: DFM Intrauterine pregnancy: [redacted]w[redacted]d     Secondary diagnosis:  Active Problems:   Decreased fetal movement   Postpartum anemia   Normal postpartum course                                Complications: None                                                              Intrapartum Procedures: spontaneous vaginal delivery, GBS prophylaxis and IOL for BPP of 4/10 with DFM Postpartum Procedures: none Complications-Operative and Postpartum: 2nd degree perineal laceration Augmentation: AROM and Pitocin   History of Present Illness: [name] is a 23 y.o. female, G1P1001, who presents at [redacted]w[redacted]d weeks gestation. The patient has been followed at  [facility]  Her pregnancy has been complicated by:  Patient Active Problem List   Diagnosis Date Noted  . Postpartum anemia 09/01/2018  . Normal postpartum course 09/01/2018  . Decreased fetal movement 08/29/2018  . Chlamydia infection affecting pregnancy in second trimester 06/05/2018    Hospital course:  Induction of Labor With Vaginal Delivery   23 y.o. yo G1P1001 at [redacted]w[redacted]d was admitted to the hospital 08/29/2018 for induction of labor.  Indication for induction: IOL for BPP of 4/10 with DFM.  Patient had an uncomplicated labor course as follows: Membrane Rupture Time/Date: 8:57 AM ,08/30/2018   Intrapartum Procedures: Episiotomy: Median [2]   Lacerations:  2nd degree [3]  Patient had delivery of a Viable infant.  Information for the patient's newborn:  Virdell, Hoiland [[id number]]  Delivery Method: Vag-Spont    08/30/2018  Details of delivery can be found in separate delivery note.  Patient had a routine postpartum course. Patient is discharged home 09/01/18. Postpartum Day # 3 : S/P NSVD due to IOL for BPP 4/10 and DFM.Marland Kitchen Patient up ad lib, denies syncope or dizziness. Reports consuming regular diet without issues and denies N/V. Patient reports 0 bowel movement + passing flatus.  Denies issues with urination and reports bleeding is "lighter."  Patient is bottle feeding and reports going well.  Desires depot which she got on 6/27 for postpartum contraception.  Pain is being appropriately managed with use of po meds.  PP anemia with no s/sx for hgb drop of 10.4-8. QBL for delivery was 651mls. Pt stable. Pt also had elevated BP during labor with nervous about process, preeclampsia labs were neg, and BP wnl PP, pt denies s/sx of HA, ruq pain or vision changes.   Physical exam  Vitals:   08/31/18 0356 08/31/18 0800 08/31/18 2300 09/01/18 0507  BP: 107/60 119/76 (!) 114/58 110/62  Pulse: (!) 50 65 (!) 57 63  Resp: 16 18 18 18   Temp: 97.6 F (36.4 C) 98.5 F (36.9 C) 98.4 F (36.9 C) 98.6 F (37 C)  TempSrc: Oral Oral Oral Oral  SpO2: 100% 100%  100%  Weight:      Height:       General: alert, cooperative and no distress Lochia: appropriate Uterine Fundus: firm Perineum: approximate no hematomas noted.  DVT Evaluation: No evidence of DVT seen on physical exam. Negative Homan's sign. No cords or calf tenderness. No significant calf/ankle edema.  Labs: Lab Results  Component Value Date   WBC 14.3 (H) 08/31/2018   HGB 8.0 (L) 08/31/2018   HCT 25.7 (L) 08/31/2018   MCV 70.2 (L) 08/31/2018   PLT 176 08/31/2018   CMP Latest Ref Rng & Units 08/29/2018  Glucose 70 - 99 mg/dL 161(W108(H)  BUN 6 - 20 mg/dL 9  Creatinine 9.600.44 -  4.541.00 mg/dL 0.980.65  Sodium 119135 - 147145 mmol/L 135  Potassium 3.5 - 5.1 mmol/L 3.7  Chloride 98 - 111 mmol/L 107  CO2 22 - 32 mmol/L 17(L)  Calcium 8.9 - 10.3 mg/dL 9.0  Total Protein 6.5 - 8.1 g/dL 6.9  Total Bilirubin 0.3 - 1.2 mg/dL 0.6  Alkaline Phos 38 - 126 U/L 120  AST 15 - 41 U/L 21  ALT 0 - 44 U/L 17    Date of discharge: 09/01/2018 Discharge Diagnoses: Term Pregnancy-delivered, PP anemia Discharge instruction: per After Visit Summary and "Baby and Me Booklet".  After visit meds:   Activity:           unrestricted and pelvic rest Advance as tolerated. Pelvic rest for 6 weeks.  Diet:                routine Medications: PNV, Ibuprofen, Colace and Iron Postpartum contraception: Depo Provera Condition:  Pt discharge to home with baby in stable Anemia: Continue at home Iron BID.   Meds: Allergies as of 09/01/2018   No Known Allergies     Medication List    TAKE these medications   Doxylamine-Pyridoxine 10-10 MG Tbec Take 1 tablet by mouth at bedtime. Two tablets at bedtime on day 1 and 2; if symptoms persist (nausea vomiting), take 1 tablet in morning and 2 tablets at bedtime on day 3; if symptoms persist, may increase to 1 tablet in morning, 1 tablet mid-afternoon, and 2 tablets at bedtime on day 4 (maximum: doxylamine 40 mg/pyridoxine 40 mg (4 tablets) per day).   ferrous sulfate 325 (65 FE) MG EC tablet Take 325 mg by mouth 3 (three) times daily with meals.   ibuprofen 100 MG/5ML suspension Commonly known as: ADVIL Take 30 mLs (600 mg total) by mouth every 6 (six) hours for 5 days.   Prenatal Complete 14-0.4 MG Tabs Take 1 tablet by mouth every morning.       Discharge Follow Up:  Follow-up Information    [facility] Follow up in 6 week(s).   Specialty: Obstetrics and Gynecology Contact information: [address] [place] [phone]           FinleyJade Leandra Vanderweele, NP-C, CNM 09/01/2018,  9:50 AM  Dale DurhamJade Kurstyn Larios, FNP

## 2020-08-01 ENCOUNTER — Other Ambulatory Visit: Payer: Self-pay

## 2020-08-01 ENCOUNTER — Encounter (HOSPITAL_COMMUNITY): Payer: Self-pay | Admitting: Emergency Medicine

## 2020-08-01 ENCOUNTER — Emergency Department (HOSPITAL_COMMUNITY)
Admission: EM | Admit: 2020-08-01 | Discharge: 2020-08-01 | Attending: Emergency Medicine | Admitting: Emergency Medicine

## 2020-08-01 DIAGNOSIS — X501XXA Overexertion from prolonged static or awkward postures, initial encounter: Secondary | ICD-10-CM | POA: Diagnosis not present

## 2020-08-01 DIAGNOSIS — Y9342 Activity, yoga: Secondary | ICD-10-CM | POA: Insufficient documentation

## 2020-08-01 DIAGNOSIS — S80912A Unspecified superficial injury of left knee, initial encounter: Secondary | ICD-10-CM | POA: Diagnosis present

## 2020-08-01 DIAGNOSIS — S83005A Unspecified dislocation of left patella, initial encounter: Secondary | ICD-10-CM | POA: Insufficient documentation

## 2020-08-01 DIAGNOSIS — Z7722 Contact with and (suspected) exposure to environmental tobacco smoke (acute) (chronic): Secondary | ICD-10-CM | POA: Diagnosis not present

## 2020-08-01 NOTE — ED Provider Notes (Signed)
[facility]-EMERGENCY DEPT Provider Note   CSN: [id number] Arrival date & time: 08/01/20  0004     History Chief Complaint  Patient presents with  . Knee Injury    Annette Price is a 25 y.o. female.  25 yo F with a chief complaints of left knee pain and deformity.  The patient was doing yoga while in prison and she felt like she dislocated her knee.  Has never done this before.  She felt like her knee moved in a position that it was not supposed to go.  She denies other injury.  The history is provided by the patient and the police.  Illness Severity:  Moderate Onset quality:  Gradual Duration:  2 hours Timing:  Constant Progression:  Unchanged Chronicity:  New Associated symptoms: no chest pain, no congestion, no fever, no headaches, no myalgias, no nausea, no rhinorrhea, no shortness of breath, no vomiting and no wheezing        History reviewed. No pertinent past medical history.  Patient Active Problem List   Diagnosis Date Noted  . Postpartum anemia 09/01/2018  . Normal postpartum course 09/01/2018  . Decreased fetal movement 08/29/2018  . Chlamydia infection affecting pregnancy in second trimester 06/05/2018    History reviewed. No pertinent surgical history.   OB History    Gravida  1   Para  1   Term  1   Preterm      AB      Living  1     SAB      IAB      Ectopic      Multiple  0   Live Births  1           History reviewed. No pertinent family history.  Social History   Tobacco Use  . Smoking status: Passive Smoke Exposure - Never Smoker  . Smokeless tobacco: Never Used  Vaping Use  . Vaping Use: Never used  Substance Use Topics  . Alcohol use: No  . Drug use: Yes    Types: Marijuana    Comment: Prior to pregnancy    Home Medications Prior to Admission medications   Medication Sig Start Date End Date Taking? Authorizing Provider  Iron-FA-B Cmp-C-Biot-Probiotic (FUSION PLUS) CAPS Take 1 capsule  by mouth daily.    [provider]  Prenatal Vit-Fe Fumarate-FA (PRENATAL COMPLETE) 14-0.4 MG TABS Take 1 tablet by mouth every morning. 01/08/18   Elvina Sidle, MD    Allergies    Patient has no known allergies.  Review of Systems   Review of Systems  Constitutional: Negative for chills and fever.  HENT: Negative for congestion and rhinorrhea.   Eyes: Negative for redness and visual disturbance.  Respiratory: Negative for shortness of breath and wheezing.   Cardiovascular: Negative for chest pain and palpitations.  Gastrointestinal: Negative for nausea and vomiting.  Genitourinary: Negative for dysuria and urgency.  Musculoskeletal: Positive for arthralgias. Negative for myalgias.  Skin: Negative for pallor and wound.  Neurological: Negative for dizziness and headaches.    Physical Exam Updated Vital Signs BP (!) 149/88 (BP Location: Left Arm)   Pulse (!) 102   Temp 99.5 F (37.5 C) (Oral)   Resp (!) 25   Ht 6\' 2"  (1.88 m)   Wt 88.5 kg   SpO2 100%   BMI 25.04 kg/m   Physical Exam Vitals and nursing note reviewed.  Constitutional:      General: She is not in acute distress.  Appearance: She is well-developed. She is not diaphoretic.  HENT:     Head: Normocephalic and atraumatic.  Eyes:     Pupils: Pupils are equal, round, and reactive to light.  Cardiovascular:     Rate and Rhythm: Normal rate and regular rhythm.     Heart sounds: No murmur heard. No friction rub. No gallop.   Pulmonary:     Effort: Pulmonary effort is normal.     Breath sounds: No wheezing or rales.  Abdominal:     General: There is no distension.     Palpations: Abdomen is soft.     Tenderness: There is no abdominal tenderness.  Musculoskeletal:        General: No tenderness.     Cervical back: Normal range of motion and neck supple.     Comments: Flexed knee with a laterally displaced patella.  Pulse motor and sensation intact distally.  Skin:    General: Skin is warm and  dry.  Neurological:     Mental Status: She is alert and oriented to person, place, and time.  Psychiatric:        Behavior: Behavior normal.     ED Results / Procedures / Treatments   Labs (all labs ordered are listed, but only abnormal results are displayed) Labs Reviewed - No data to display  EKG None  Radiology No results found.  Procedures Reduction of dislocation  Date/Time: 08/01/2020 12:18 AM Performed by: Melene Plan, DO Authorized by: Melene Plan, DO  Consent: Verbal consent obtained. Risks and benefits: risks, benefits and alternatives were discussed Consent given by: patient Patient identity confirmed: verbally with patient Local anesthesia used: no  Anesthesia: Local anesthesia used: no  Sedation: Patient sedated: no  Patient tolerance: patient tolerated the procedure well with no immediate complications      Medications Ordered in ED Medications - No data to display  ED Course  I have reviewed the triage vital signs and the nursing notes.  Pertinent labs & imaging results that were available during my care of the patient were reviewed by me and considered in my medical decision making (see chart for details).    MDM Rules/Calculators/A&P                          24 yo F with a chief complaints of a left patella dislocation.  Reduced at bedside.  Placed in a knee immobilizer.  Ortho follow-up.  12:18 AM:  I have discussed the diagnosis/risks/treatment options with the patient and believe the pt to be eligible for discharge home to follow-up with Ortho. We also discussed returning to the ED immediately if new or worsening sx occur. We discussed the sx which are most concerning (e.g., sudden worsening pain, fever, inability to tolerate by mouth) that necessitate immediate return. Medications administered to the patient during their visit and any new prescriptions provided to the patient are listed below.  Medications given during this  visit Medications - No data to display   The patient appears reasonably screen and/or stabilized for discharge and I doubt any other medical condition or other [facility] requiring further screening, evaluation, or treatment in the ED at this time prior to discharge.   Final Clinical Impression(s) / ED Diagnoses Final diagnoses:  Dislocation of left patella, initial encounter    Rx / DC Orders ED Discharge Orders    None       Melene Plan, DO 08/01/20 0019

## 2020-08-01 NOTE — Discharge Instructions (Signed)
Follow up with ortho in the office

## 2020-08-01 NOTE — ED Triage Notes (Signed)
Patient brought by [facility] from jail. Patient was doing yoga in the cell and dislocated left knee.

## 2023-08-30 ENCOUNTER — Encounter: Payer: Self-pay | Admitting: Obstetrics and Gynecology

## 2023-08-30 ENCOUNTER — Other Ambulatory Visit (HOSPITAL_COMMUNITY)
Admission: RE | Admit: 2023-08-30 | Discharge: 2023-08-30 | Disposition: A | Discharge status: Home / Self Care | Source: Ambulatory Visit | Attending: Obstetrics and Gynecology | Admitting: Obstetrics and Gynecology

## 2023-08-30 ENCOUNTER — Ambulatory Visit (INDEPENDENT_AMBULATORY_CARE_PROVIDER_SITE_OTHER): Admitting: Obstetrics and Gynecology

## 2023-08-30 VITALS — BP 130/85 | HR 78 | Ht 74.0 in | Wt 195.0 lb

## 2023-08-30 DIAGNOSIS — Z23 Encounter for immunization: Secondary | ICD-10-CM | POA: Diagnosis not present

## 2023-08-30 DIAGNOSIS — Z1331 Encounter for screening for depression: Secondary | ICD-10-CM | POA: Diagnosis not present

## 2023-08-30 DIAGNOSIS — Z01419 Encounter for gynecological examination (general) (routine) without abnormal findings: Secondary | ICD-10-CM | POA: Diagnosis not present

## 2023-08-30 DIAGNOSIS — N898 Other specified noninflammatory disorders of vagina: Secondary | ICD-10-CM

## 2023-08-30 DIAGNOSIS — Z113 Encounter for screening for infections with a predominantly sexual mode of transmission: Secondary | ICD-10-CM

## 2023-08-30 LAB — CERVICOVAGINAL ANCILLARY ONLY
Bacterial Vaginitis (gardnerella): NEGATIVE
Candida Glabrata: NEGATIVE
Candida Vaginitis: NEGATIVE
Chlamydia: NEGATIVE
Comment: NEGATIVE
Comment: NEGATIVE
Comment: NEGATIVE
Comment: NEGATIVE
Comment: NEGATIVE
Comment: NORMAL
Neisseria Gonorrhea: NEGATIVE
Trichomonas: NEGATIVE

## 2023-08-30 NOTE — Addendum Note (Signed)
 Addended by: Julen Rubert J on: 08/30/2023 10:13 AM   Modules accepted: Orders

## 2023-08-30 NOTE — Progress Notes (Signed)
 ANNUAL EXAM Patient name: Annette Price MRN [id number]  Date of birth: 1995/11/15 Chief Complaint:   NEW PATIENT/GYN  History of Present Illness:   Annette Price is a 28 y.o. G1P1001 with Patient's last menstrual period was 08/20/2023 (exact date). being seen today for a routine annual exam.  Current complaints:  Vaginal discharge x 4 years. Has been treated for BV but reports her BV testing has been negative. Notes things get better on the medication but then get worse immediately after stopping it. Denies douching or anything in the vagina.  Discharge is clumpy, notes odor within an hour after showering. No abdominal pain  Also notes breast cyst, size changes and it has gotten inflamed. Getting breast US  q 6 months.   Currently incarcerated.    [facility] - 08/30/23 0933       Pregnancy Intention Screening   Does the patient want to become pregnant in the next year? No    Does the patient's partner want to become pregnant in the next year? No    Would the patient like to discuss contraceptive options today? No      Contraception Wrap Up   Current Method Abstinence    End Method Abstinence    Contraception Counseling Provided No    How was the end contraceptive method provided? N/A         The pregnancy intention screening data noted above was reviewed. Potential methods of contraception were discussed. The patient elected to proceed with Abstinence.   Last pap 2020. Results were: negative per pt report. H/O abnormal pap: no Last mammogram: n/a. Results were: abnormal breast cyst on US  in surveillance . Family h/o breast cancer: no Last colonoscopy: n/a. Family h/o colorectal cancer: no HPV vaccine: completed     08/30/2023    9:32 AM  Depression screen PHQ 2/9  Decreased Interest 0  Down, Depressed, Hopeless 0  PHQ - 2 Score 0  Altered sleeping 0  Tired, decreased energy 0  Change in appetite 0  Feeling bad or failure about yourself  0  Trouble concentrating 0   Moving slowly or fidgety/restless 0  Suicidal thoughts 0  PHQ-9 Score 0  Difficult doing work/chores Not difficult at all        08/30/2023    9:32 AM  GAD 7 : Generalized Anxiety Score  Nervous, Anxious, on Edge 0  Control/stop worrying 0  Worry too much - different things 0  Trouble relaxing 0  Restless 0  Easily annoyed or irritable 0  Afraid - awful might happen 0  Total GAD 7 Score 0  Anxiety Difficulty Not difficult at all     Review of Systems:   Pertinent items are noted in HPI Denies any headaches, blurred vision, fatigue, shortness of breath, chest pain, abdominal pain, abnormal vaginal discharge/itching/odor/irritation, problems with periods, bowel movements, urination, or intercourse unless otherwise stated above. Pertinent History Reviewed:  Reviewed past medical,surgical, social and family history.  Reviewed problem list, medications and allergies. Physical Assessment:   Vitals:   08/30/23 0923 08/30/23 0926  BP: (!) 142/94 130/85  Pulse: 81 78  Weight: 195 lb (88.5 kg)   Height: 6' 2 (1.88 m)   Body mass index is 25.04 kg/m.        Physical Examination:   General appearance - well appearing, and in no distress  Mental status - alert, oriented to person, place, and time  Chest - respiratory effort normal  Heart - normal peripheral perfusion  Breasts -  right breast with 1cm mobile mass c/w cyst  Abdomen - soft, nontender, nondistended, no masses or organomegaly  Pelvic - VULVA: normal appearing vulva with no masses, tenderness or lesions  VAGINA: normal appearing vagina.  CERVIX: normal appearing cervix   Thin prep pap is done with reflex HR HPV cotesting  Chaperone present for exam  No results found for this or any previous visit (from the past 24 hours).  Assessment & Plan:  1) Well-Woman Exam Mammogram: continue surveillance of R breast cyst Colonoscopy: @ 28yo, or sooner if problems Pap: Collected today Gardasil: Completed GC/CT:  Collected HIV/HCV: Collected  2) Vaginal discharge Unclear etiology on exam today, will f/u results and treat accordingly -     Cervicovaginal ancillary only( )  Labs/procedures today:   Orders Placed This Encounter  Procedures   HIV antibody (with reflex)   Hepatitis C Antibody   Hepatitis B Surface AntiGEN   RPR   Meds: No orders of the defined types were placed in this encounter.  Follow-up: Return in about 1 year (around 08/29/2024) for annual exam or sooner as needed.  Kieth JAYSON Carolin, MD 08/30/2023 10:02 AM

## 2023-08-30 NOTE — Progress Notes (Signed)
 64 New GYN presents for AEX/PAP/STD screening.  C/o lump in R breast, vaginal discharge, odor.

## 2023-08-31 ENCOUNTER — Ambulatory Visit: Payer: Self-pay | Admitting: Obstetrics and Gynecology

## 2023-08-31 LAB — RPR: RPR Ser Ql: NONREACTIVE

## 2023-08-31 LAB — HEPATITIS B SURFACE ANTIGEN: Hepatitis B Surface Ag: NEGATIVE

## 2023-08-31 LAB — HIV ANTIBODY (ROUTINE TESTING W REFLEX): HIV Screen 4th Generation wRfx: NONREACTIVE

## 2023-08-31 LAB — HEPATITIS C ANTIBODY: Hep C Virus Ab: NONREACTIVE

## 2023-09-04 LAB — CYTOLOGY - PAP
Adequacy: ABSENT
Comment: NEGATIVE
Diagnosis: UNDETERMINED — AB
High risk HPV: NEGATIVE

## 2024-01-18 ENCOUNTER — Ambulatory Visit (INDEPENDENT_AMBULATORY_CARE_PROVIDER_SITE_OTHER): Admitting: Obstetrics and Gynecology

## 2024-01-18 ENCOUNTER — Other Ambulatory Visit (HOSPITAL_COMMUNITY)
Admission: RE | Admit: 2024-01-18 | Discharge: 2024-01-18 | Disposition: A | Source: Ambulatory Visit | Attending: Obstetrics and Gynecology | Admitting: Obstetrics and Gynecology

## 2024-01-18 VITALS — BP 138/87 | HR 92

## 2024-01-18 DIAGNOSIS — N761 Subacute and chronic vaginitis: Secondary | ICD-10-CM

## 2024-01-18 DIAGNOSIS — Z23 Encounter for immunization: Secondary | ICD-10-CM

## 2024-01-18 NOTE — Progress Notes (Signed)
   RETURN GYNECOLOGY VISIT  Subjective:  Renn Stille is a 28 y.o. G1P1000 presenting for follow up of vaginal discharge  Chronic issue of discharge with odor for several years. Swabs at visit 08/30/23 unremarkable. Discussed general vulvar care.   Today, reports no improvement in her symptoms. Did not make changes as this was not communicated to her. Using [employer]. Does not wear underwear. Has had improvement with boric acid suppositories in the past but symptoms quickly return  Patient is incarcerated and symptoms started after incarceration. She has limited control of environment but can control soap.  Objective:   Vitals:   01/18/24 0837  BP: 138/87  Pulse: 92   General:  Alert, oriented and cooperative. Patient is in no acute distress.  Skin: Skin is warm and dry. No rash noted.   Cardiovascular: Normal heart rate noted  Respiratory: Normal respiratory effort, no problems with respiration noted  Abdomen: Soft, non-tender, non-distended   Pelvic: NEFG. No significant discharge or odor appreciated  Exam performed in the presence of a chaperone  Assessment and Plan:  Xaria Judon is a 28 y.o. with chronic vaginitis  Chronic vaginitis If swabs are all normal, discussed pros/cons of boric acid suppositories nightly x 30 days. Overall low risk with this intervention, but in theory could worsen discharge. She is accepting of this risk and would like to try it if swabs do not show anything helpful -     Cervicovaginal ancillary only( [facility]) -     Mycoplasma / ureaplasma culture -     Candida 6 Species Profile, NAA  Will give flu shot today  Kieth JAYSON Carolin, MD

## 2024-01-18 NOTE — Addendum Note (Signed)
 Addended by: JERRYE AREA D on: 01/18/2024 09:31 AM   Modules accepted: Orders

## 2024-01-21 LAB — CERVICOVAGINAL ANCILLARY ONLY
Bacterial Vaginitis (gardnerella): NEGATIVE
Candida Glabrata: NEGATIVE
Candida Vaginitis: NEGATIVE
Chlamydia: NEGATIVE
Comment: NEGATIVE
Comment: NEGATIVE
Comment: NEGATIVE
Comment: NEGATIVE
Comment: NEGATIVE
Comment: NORMAL
Neisseria Gonorrhea: NEGATIVE
Trichomonas: NEGATIVE

## 2024-01-22 LAB — CANDIDA 6 SPECIES PROFILE, NAA
C PARAPSILOSIS/TROPICALIS: NEGATIVE
Candida albicans, NAA: NEGATIVE
Candida glabrata, NAA: NEGATIVE
Candida krusei, NAA: NEGATIVE
Candida lusitaniae, NAA: NEGATIVE

## 2024-01-25 ENCOUNTER — Ambulatory Visit: Payer: Self-pay | Admitting: Obstetrics and Gynecology

## 2024-01-25 LAB — MYCOPLASMA / UREAPLASMA CULTURE
Mycoplasma hominis Culture: NEGATIVE
Ureaplasma urealyticum: NEGATIVE
